# Patient Record
Sex: Female | Born: 1980 | Race: Black or African American | Hispanic: No | Marital: Single | Smoking: Current some day smoker
Health system: Southern US, Community
[De-identification: ages and names within clinical notes are randomized; demographics above are authoritative.]

## PROBLEM LIST (undated history)

## (undated) DIAGNOSIS — D509 Iron deficiency anemia, unspecified: Secondary | ICD-10-CM

## (undated) DIAGNOSIS — D649 Anemia, unspecified: Secondary | ICD-10-CM

## (undated) DIAGNOSIS — F419 Anxiety disorder, unspecified: Secondary | ICD-10-CM

## (undated) DIAGNOSIS — K5090 Crohn's disease, unspecified, without complications: Secondary | ICD-10-CM

## (undated) DIAGNOSIS — N3289 Other specified disorders of bladder: Secondary | ICD-10-CM

## (undated) DIAGNOSIS — L732 Hidradenitis suppurativa: Secondary | ICD-10-CM

## (undated) DIAGNOSIS — K56609 Unspecified intestinal obstruction, unspecified as to partial versus complete obstruction: Secondary | ICD-10-CM

## (undated) DIAGNOSIS — T8859XA Other complications of anesthesia, initial encounter: Secondary | ICD-10-CM

## (undated) DIAGNOSIS — F32A Depression, unspecified: Secondary | ICD-10-CM

## (undated) DIAGNOSIS — Z5189 Encounter for other specified aftercare: Secondary | ICD-10-CM

## (undated) HISTORY — PX: CYSTOSCOPY: SUR368

---

## 2000-10-18 ENCOUNTER — Emergency Department (HOSPITAL_COMMUNITY): Admission: EM | Admit: 2000-10-18 | Discharge: 2000-10-18 | Payer: Self-pay | Admitting: Emergency Medicine

## 2000-10-18 ENCOUNTER — Encounter: Payer: Self-pay | Admitting: Emergency Medicine

## 2000-11-03 ENCOUNTER — Emergency Department (HOSPITAL_COMMUNITY): Admission: EM | Admit: 2000-11-03 | Discharge: 2000-11-03 | Payer: Self-pay

## 2000-11-14 ENCOUNTER — Emergency Department (HOSPITAL_COMMUNITY): Admission: EM | Admit: 2000-11-14 | Discharge: 2000-11-14 | Payer: Self-pay

## 2003-02-15 ENCOUNTER — Emergency Department (HOSPITAL_COMMUNITY): Admission: EM | Admit: 2003-02-15 | Discharge: 2003-02-15 | Payer: Self-pay | Admitting: Emergency Medicine

## 2003-02-24 ENCOUNTER — Emergency Department (HOSPITAL_COMMUNITY): Admission: AD | Admit: 2003-02-24 | Discharge: 2003-02-24 | Payer: Self-pay | Admitting: Emergency Medicine
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
## 2003-10-25 ENCOUNTER — Emergency Department (HOSPITAL_COMMUNITY): Admission: EM | Admit: 2003-10-25 | Discharge: 2003-10-25 | Payer: Self-pay | Admitting: Emergency Medicine

## 2005-05-01 ENCOUNTER — Emergency Department (HOSPITAL_COMMUNITY): Admission: EM | Admit: 2005-05-01 | Discharge: 2005-05-01 | Payer: Self-pay | Admitting: Family Medicine

## 2006-10-18 ENCOUNTER — Emergency Department (HOSPITAL_COMMUNITY): Admission: EM | Admit: 2006-10-18 | Discharge: 2006-10-18 | Payer: Self-pay | Admitting: Emergency Medicine

## 2006-10-22 ENCOUNTER — Emergency Department (HOSPITAL_COMMUNITY): Admission: EM | Admit: 2006-10-22 | Discharge: 2006-10-22 | Payer: Self-pay | Admitting: Family Medicine

## 2007-04-24 ENCOUNTER — Emergency Department (HOSPITAL_COMMUNITY): Admission: EM | Admit: 2007-04-24 | Discharge: 2007-04-24 | Payer: Self-pay | Admitting: Family Medicine

## 2007-04-26 ENCOUNTER — Emergency Department (HOSPITAL_COMMUNITY): Admission: EM | Admit: 2007-04-26 | Discharge: 2007-04-26 | Payer: Self-pay | Admitting: Emergency Medicine

## 2007-11-05 ENCOUNTER — Emergency Department (HOSPITAL_COMMUNITY): Admission: EM | Admit: 2007-11-05 | Discharge: 2007-11-05 | Payer: Self-pay | Admitting: Family Medicine

## 2008-08-16 ENCOUNTER — Emergency Department (HOSPITAL_COMMUNITY): Admission: EM | Admit: 2008-08-16 | Discharge: 2008-08-16 | Payer: Self-pay | Admitting: Emergency Medicine

## 2009-02-14 ENCOUNTER — Emergency Department (HOSPITAL_COMMUNITY): Admission: EM | Admit: 2009-02-14 | Discharge: 2009-02-14 | Payer: Self-pay | Admitting: Emergency Medicine
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
## 2010-04-19 LAB — POCT URINALYSIS DIP (DEVICE)
Bilirubin Urine: NEGATIVE
Glucose, UA: NEGATIVE mg/dL
Ketones, ur: NEGATIVE mg/dL
Nitrite: NEGATIVE
Protein, ur: NEGATIVE mg/dL
Specific Gravity, Urine: 1.01 (ref 1.005–1.030)
Urobilinogen, UA: 0.2 mg/dL (ref 0.0–1.0)
pH: 6.5 (ref 5.0–8.0)

## 2010-04-19 LAB — POCT PREGNANCY, URINE: Preg Test, Ur: NEGATIVE

## 2010-10-07 LAB — POCT PREGNANCY, URINE
Operator id: 235561
Preg Test, Ur: NEGATIVE

## 2010-10-07 LAB — WOUND CULTURE: Culture: NO GROWTH

## 2010-10-13 LAB — WET PREP, GENITAL: WBC, Wet Prep HPF POC: NONE SEEN

## 2010-10-13 LAB — GC/CHLAMYDIA PROBE AMP, GENITAL: GC Probe Amp, Genital: NEGATIVE

## 2010-10-13 LAB — POCT URINALYSIS DIP (DEVICE)
Ketones, ur: 40 — AB
Protein, ur: NEGATIVE
Specific Gravity, Urine: 1.015
Urobilinogen, UA: 1
pH: 6

## 2010-10-23 LAB — CBC
Hemoglobin: 11.6 — ABNORMAL LOW
MCHC: 32.9
RBC: 4.58
WBC: 13.4 — ABNORMAL HIGH

## 2010-10-23 LAB — POCT URINALYSIS DIP (DEVICE)
Protein, ur: 100 — AB
Specific Gravity, Urine: 1.015
Urobilinogen, UA: 0.2

## 2010-10-23 LAB — I-STAT 8, (EC8 V) (CONVERTED LAB)
Acid-Base Excess: 1
Bicarbonate: 25.6 — ABNORMAL HIGH
Chloride: 104
HCT: 42
Hemoglobin: 14.3
Operator id: 116391
Sodium: 138
pCO2, Ven: 39.4 — ABNORMAL LOW

## 2010-10-23 LAB — POCT I-STAT CREATININE: Creatinine, Ser: 0.7

## 2010-10-23 LAB — DIFFERENTIAL
Basophils Relative: 0
Lymphocytes Relative: 5 — ABNORMAL LOW
Monocytes Relative: 4
Neutro Abs: 12.1 — ABNORMAL HIGH
Neutrophils Relative %: 90 — ABNORMAL HIGH

## 2011-05-23 ENCOUNTER — Emergency Department (INDEPENDENT_AMBULATORY_CARE_PROVIDER_SITE_OTHER)
Admission: EM | Admit: 2011-05-23 | Discharge: 2011-05-23 | Disposition: A | Discharge status: Home / Self Care | Payer: Self-pay | Source: Home / Self Care | Attending: Emergency Medicine | Admitting: Emergency Medicine

## 2011-05-23 ENCOUNTER — Encounter (HOSPITAL_COMMUNITY): Payer: Self-pay | Admitting: Emergency Medicine

## 2011-05-23 DIAGNOSIS — L0291 Cutaneous abscess, unspecified: Secondary | ICD-10-CM

## 2011-05-23 DIAGNOSIS — L03319 Cellulitis of trunk, unspecified: Secondary | ICD-10-CM

## 2011-05-23 MED ORDER — ONDANSETRON 4 MG PO TBDP
ORAL_TABLET | ORAL | Status: AC
  Filled 2011-05-23: qty 2

## 2011-05-23 MED ORDER — CEFTRIAXONE SODIUM 1 G IJ SOLR
1.0000 g | Freq: Once | INTRAMUSCULAR | Status: AC
  Administered 2011-05-23: 1 g via INTRAMUSCULAR

## 2011-05-23 MED ORDER — SULFAMETHOXAZOLE-TMP DS 800-160 MG PO TABS: 2.0000 | ORAL_TABLET | Freq: Two times a day (BID) | ORAL | Status: AC

## 2011-05-23 MED ORDER — HYDROMORPHONE HCL PF 1 MG/ML IJ SOLN
1.0000 mg | Freq: Once | INTRAMUSCULAR | Status: AC
  Administered 2011-05-23: 1 mg via INTRAMUSCULAR

## 2011-05-23 MED ORDER — CEFTRIAXONE SODIUM 1 G IJ SOLR
INTRAMUSCULAR | Status: AC
  Filled 2011-05-23: qty 10

## 2011-05-23 MED ORDER — OXYCODONE-ACETAMINOPHEN 5-325 MG PO TABS: ORAL_TABLET | ORAL | Status: AC

## 2011-05-23 MED ORDER — ONDANSETRON 4 MG PO TBDP
8.0000 mg | ORAL_TABLET | Freq: Once | ORAL | Status: AC
  Administered 2011-05-23: 8 mg via ORAL

## 2011-05-23 MED ORDER — LIDOCAINE HCL (PF) 1 % IJ SOLN
INTRAMUSCULAR | Status: AC
  Filled 2011-05-23: qty 5

## 2011-05-23 MED ORDER — HYDROMORPHONE HCL PF 1 MG/ML IJ SOLN
INTRAMUSCULAR | Status: AC
  Filled 2011-05-23: qty 1

## 2011-05-23 NOTE — ED Notes (Signed)
Pt states about 3 days ago she started having "chest pain". She had some soreness especially under right breast which has gotten progressively worse. She states she does not know of an injury, but she did wear a wire bra.

## 2011-05-23 NOTE — Discharge Instructions (Signed)

## 2011-05-23 NOTE — ED Provider Notes (Signed)
Chief Complaint  Patient presents with  . Abscess    History of Present Illness:   The patient is a 31 year old female who has had a 3 to four-day history of a painful abscess in the right submammary area. This has not drained any pus. She denies any fever or chills. She had a boil on her buttock when she was a child. No other skin abscesses or skin lesions. No history of MRSA or diabetes.  Review of Systems:  Other than noted above, the patient denies any of the following symptoms: Systemic:  No fever, chills or sweats. Skin:  No rash or itching.  PMFSH:  Past medical history, family history, social history, meds, and allergies were reviewed.  No history of diabetes or prior history of abscesses or MRSA.  Physical Exam:   Vital signs:  BP 123/75  Pulse 77  Temp(Src) 98.5 F (36.9 C) (Oral)  Resp 18  SpO2 100%  LMP 05/13/2011 Skin:  She has a large abscess in the right submammary area measuring 5 x 10 cm in size. This was fluctuant, raised, and very tender. Skin was otherwise normal.  No rash.  Procedure:  Verbal informed consent was obtained.  The patient was informed of the risks and benefits of the procedure and understands and accepts.  Identity of the patient was verified verbally and by wristband.   The abscess area described above was prepped with Betadine and alcohol and anesthetized with 5 mL of 2% Xylocaine with epinephrine.  Using a #11 scalpel blade, a singe straight incision was made into the area of fluctulence, yielding a very large amount of malodorous, prurulent drainage.  Routine cultures were obtained.  Blunt dissection was used to break up loculations and the resulting wound cavity was packed with one half inch Iodoform gauze.  A sterile pressure dressing was applied.  Course in Urgent Care Center:   She was given Rocephin 1 g IM, hydromorphone 1 mg IM, and Zofran 8 mg by mouth and tolerated all these meds well without any immediate side effects.  Assessment:  The  encounter diagnosis was Abscess.  Plan:   1.  The following meds were prescribed:   New Prescriptions   OXYCODONE-ACETAMINOPHEN (PERCOCET) 5-325 MG PER TABLET    1 to 2 tablets every 6 hours as needed for pain.   SULFAMETHOXAZOLE-TRIMETHOPRIM (BACTRIM DS) 800-160 MG PER TABLET    Take 2 tablets by mouth 2 (two) times daily.   2.  The patient was instructed in symptomatic care and handouts were given. 3.  The patient was instructed to leave the dressing in place and return again in 48 hours for packing removal. Since this was such a large abscess, it will need to be repacked.  Reuben Likes, MD 05/23/11 [phone]

## 2011-05-25 ENCOUNTER — Emergency Department (INDEPENDENT_AMBULATORY_CARE_PROVIDER_SITE_OTHER)
Admission: EM | Admit: 2011-05-25 | Discharge: 2011-05-25 | Disposition: A | Discharge status: Home / Self Care | Payer: Self-pay | Source: Home / Self Care | Attending: Emergency Medicine | Admitting: Emergency Medicine

## 2011-05-25 ENCOUNTER — Encounter (HOSPITAL_COMMUNITY): Payer: Self-pay | Admitting: Emergency Medicine

## 2011-05-25 DIAGNOSIS — L0291 Cutaneous abscess, unspecified: Secondary | ICD-10-CM

## 2011-05-25 NOTE — ED Notes (Signed)
Pt here for abscess recheck UNDER R BREAST S/P I/D WITH PACKING X 2 DYS AGO.PACKING INTACT WITH OLS SATURATED SERASANG DRAINAGE.TENDERNESS AND MIN DRAINAGE SEEN.DENIES FEVER,CHILLS,N,V.TAKING PRESCRIBED ATB AND PAIN MEDS

## 2011-05-25 NOTE — ED Notes (Signed)
4X4 KERLIX DRESSING APPLIED AND COVERED WITH MEDIPORE TAPE

## 2011-05-25 NOTE — Discharge Instructions (Signed)

## 2011-05-25 NOTE — ED Provider Notes (Signed)
Chief Complaint  Patient presents with  . Wound Check    History of Present Illness:   Arvis returns for a wound recheck. The packing is still in place. She has been taking her antibiotics and denies any fever chills. The area is still painful, but less so. She has been taking her Percocet for pain. Her culture is so far negative.  Review of Systems:  Other than noted above, the patient denies any of the following symptoms: Systemic:  No fever, chills, sweats, weight loss, or fatigue. ENT:  No nasal congestion, rhinorrhea, sore throat, swelling of lips, tongue or throat. Resp:  No cough, wheezing, or shortness of breath. Skin:  No rash, itching, nodules, or suspicious lesions.  PMFSH:  Past medical history, family history, social history, meds, and allergies were reviewed.  Physical Exam:   Vital signs:  BP 107/72  Pulse 80  Temp(Src) 98 F (36.7 C) (Oral)  Resp 20  SpO2 99%  LMP 05/13/2011 Gen:  Alert, oriented, in no distress. Skin:  The dressing was removed and the packing is still in place. There still some tenderness and slight induration, but overall the wound looks considerably better. The packing was removed and the wound cavity still has a little bit of purulent drainage.  Course in Urgent Care Center:   The wound was repacked and a dressing was applied. The patient was instructed to leave the packing in place and return again in another 48 hours. I think at that time we can be the packing out completely.  Assessment:  The encounter diagnosis was Abscess.  Plan:   1.  The following meds were prescribed:   New Prescriptions   No medications on file   2.  The patient was instructed in symptomatic care and handouts were given. 3.  The patient was told to return if becoming worse in any way, if no better in 3 or 4 days, and given some red flag symptoms that would indicate earlier return. She should return again in another 48 hours for packing removal.     Reuben Likes, MD 05/25/11 [phone]

## 2011-05-27 LAB — CULTURE, ROUTINE-ABSCESS

## 2012-10-20 ENCOUNTER — Encounter (HOSPITAL_COMMUNITY): Payer: Self-pay | Admitting: Emergency Medicine

## 2012-10-20 ENCOUNTER — Emergency Department (HOSPITAL_COMMUNITY)
Admission: EM | Admit: 2012-10-20 | Discharge: 2012-10-20 | Disposition: A | Discharge status: Home / Self Care | Payer: Self-pay | Attending: Emergency Medicine | Admitting: Emergency Medicine

## 2012-10-20 DIAGNOSIS — R21 Rash and other nonspecific skin eruption: Secondary | ICD-10-CM | POA: Insufficient documentation

## 2012-10-20 DIAGNOSIS — Z87891 Personal history of nicotine dependence: Secondary | ICD-10-CM | POA: Insufficient documentation

## 2012-10-20 MED ORDER — DIPHENHYDRAMINE HCL 25 MG PO TABS: 25.0000 mg | ORAL_TABLET | Freq: Four times a day (QID) | ORAL | Status: DC | PRN

## 2012-10-20 NOTE — ED Provider Notes (Signed)
CSN: [id number]     Arrival date & time 10/20/12  1250 History   First MD Initiated Contact with Patient 10/20/12 1259     Chief Complaint  Patient presents with  . Rash   (Consider location/radiation/quality/duration/timing/severity/associated sxs/prior Treatment) HPI  This is a 32 year old female who presents with rash. She had onset of fine bumps over her mouth and arms yesterday.  She states they're itchy. She's not had any fevers.  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 She denies any new soaps, lotions, or detergents. She denies any new foods. She has no other complaints at this time.  History reviewed. No pertinent past medical history. History reviewed. No pertinent past surgical history. History reviewed. No pertinent family history. History  Substance Use Topics  . Smoking status: Former Games developer  . Smokeless tobacco: Never Used  . Alcohol Use: Yes     Comment: occasional   OB History   Grav Para Term Preterm Abortions TAB SAB Ect Mult Living                 Review of Systems  Constitutional: Negative for fever.  Respiratory: Negative for shortness of breath.   Cardiovascular: Negative for chest pain.  Skin: Positive for rash.    Allergies  Review of patient's allergies indicates no known allergies.  Home Medications   Current Outpatient Rx  Name  Route  Sig  Dispense  Refill  . diphenhydrAMINE (BENADRYL) 25 MG tablet   Oral   Take 1 tablet (25 mg total) by mouth every 6 (six) hours as needed for itching.   20 tablet   0    BP 120/75  Pulse 72  Temp(Src) 98.5 F (36.9 C) (Oral)  Resp 16  SpO2 100%  LMP 10/10/2012 Physical Exam  Nursing note and vitals reviewed. Constitutional: She is oriented to person, place, and time. She appears well-developed and well-nourished. No distress.  HENT:  Head: Normocephalic and atraumatic.  Mouth/Throat: Oropharynx is clear and moist. No  oropharyngeal exudate.  Oropharynx clear without evidence of ulcerations or petechiae. No erythema Fine papular rash over the skin around the mouth, no erythema noted  Eyes: Pupils are equal, round, and reactive to light.  Neck: Neck supple.  fine papular rash over the right anterior neck without erythema  Cardiovascular: Normal rate, regular rhythm and normal heart sounds.   Pulmonary/Chest: Effort normal and breath sounds normal. No respiratory distress. She has no wheezes.  Abdominal: Soft. There is no tenderness.  Neurological: She is alert and oriented to person, place, and time.  Skin: Skin is warm and dry.  Rash as noted above, no rash noted over the bilateral palms  Psychiatric: She has a normal mood and affect.    ED Course  Procedures (including critical care time) Labs Review Labs Reviewed - No data to display Imaging Review No results found.  EKG Interpretation   None       MDM   1. Rash    This a 32 year old female who presents with rash. She is nontoxic-appearing on exam and vital signs are within normal limits. She has a fine papular rash which appears reactive versus allergic. No evidence of overlying infection. Patient is driving [place] give her any Benadryl here. She'll be instructed to take Benadryl at home. At this time I have low suspicion for hand-foot-and-mouth disease that she has no oral lesions or evidence of rash on her hands.  Patient was given strict return precautions.  After history, exam, and medical workup I feel the patient has been appropriately medically screened and is safe for discharge home. Pertinent diagnoses were discussed with the patient. Patient was given return precautions.    Shon Baton, MD 10/20/12 [phone]

## 2012-10-20 NOTE — ED Notes (Addendum)
Pt c/o rash on mouth and chest.  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

## 2013-06-22 ENCOUNTER — Ambulatory Visit: Payer: Self-pay | Admitting: Obstetrics & Gynecology

## 2013-07-13 ENCOUNTER — Ambulatory Visit: Payer: Self-pay | Admitting: Obstetrics & Gynecology

## 2013-08-07 ENCOUNTER — Emergency Department (HOSPITAL_COMMUNITY)
Admission: EM | Admit: 2013-08-07 | Discharge: 2013-08-07 | Disposition: A | Discharge status: Home / Self Care | Payer: No Typology Code available for payment source | Attending: Emergency Medicine | Admitting: Emergency Medicine

## 2013-08-07 ENCOUNTER — Encounter (HOSPITAL_COMMUNITY): Payer: Self-pay | Admitting: Emergency Medicine

## 2013-08-07 DIAGNOSIS — L02419 Cutaneous abscess of limb, unspecified: Secondary | ICD-10-CM | POA: Insufficient documentation

## 2013-08-07 DIAGNOSIS — L02415 Cutaneous abscess of right lower limb: Secondary | ICD-10-CM

## 2013-08-07 DIAGNOSIS — Z87891 Personal history of nicotine dependence: Secondary | ICD-10-CM | POA: Insufficient documentation

## 2013-08-07 DIAGNOSIS — L03119 Cellulitis of unspecified part of limb: Principal | ICD-10-CM

## 2013-08-07 NOTE — Discharge Instructions (Signed)

## 2013-08-07 NOTE — ED Provider Notes (Signed)
CSN: [id number]     Arrival date & time 08/07/13  0908 History   First MD Initiated Contact with Patient 08/07/13 [phone]     Chief Complaint  Patient presents with  . bump on leg      (Consider location/radiation/quality/duration/timing/severity/associated sxs/prior Treatment) The history is provided by the patient.   patient presents with abscess right lower leg on anterior shin.  Patient has history of abscess.  No fevers or chills.  No significant spreading redness.  His been present for 2 days.  It seems to be getting larger.  No other complaints.  Pain is mild to moderate in severity.  Pain is worse with palpation  History reviewed. No pertinent past medical history. History reviewed. No pertinent past surgical history. No family history on file. History  Substance Use Topics  . Smoking status: Former Research scientist (life sciences)  . Smokeless tobacco: Never Used  . Alcohol Use: Yes     Comment: occasional   OB History   Grav Para Term Preterm Abortions TAB SAB Ect Mult Living                 Review of Systems  All other systems reviewed and are negative.     Allergies  Review of patient's allergies indicates no known allergies.  Home Medications   Prior to Admission medications   Medication Sig Start Date End Date Taking? Authorizing Provider  Fe Fum-Fe Poly-Vit C-Lactobac (FUSION PO) Take 1 capsule by mouth every morning.   Yes Historical Provider, MD  ibuprofen (ADVIL,MOTRIN) 200 MG tablet Take 600-800 mg by mouth every 6 (six) hours as needed for moderate pain.   Yes Historical Provider, MD   BP 127/64  Pulse 85  Temp(Src) 98.4 F (36.9 C) (Oral)  Resp 18  SpO2 100%  LMP 07/24/2013 Physical Exam  Nursing note and vitals reviewed. Constitutional: She is oriented to person, place, and time. She appears well-developed and well-nourished. No distress.  HENT:  Head: Normocephalic and atraumatic.  Eyes: EOM are normal.  Neck: Normal range of motion.  Cardiovascular: Normal rate,  regular rhythm and normal heart sounds.   Pulmonary/Chest: Effort normal and breath sounds normal.  Abdominal: Soft. She exhibits no distension. There is no tenderness.  Musculoskeletal: Normal range of motion.  Small abscess on the right anterior lower leg involving the proximal third of the tibia.  Pointing without drainage.  Very small surrounding erythema.  Small induration.  There is a very small amount of fluctuance  Neurological: She is alert and oriented to person, place, and time.  Skin: Skin is warm and dry.  Psychiatric: She has a normal mood and affect. Judgment normal.    ED Course  Procedures (including critical care time)  INCISION AND DRAINAGE Performed by: Hoy Morn Consent: Verbal consent obtained. Risks and benefits: risks, benefits and alternatives were discussed Time out performed prior to procedure Type: abscess Body area: right anterior tibia Anesthesia: local infiltration Incision was made with a scalpel. Local anesthetic: lidocaine 2% with epinephrine Anesthetic total: 3 ml Complexity: complex Blunt dissection to break up loculations Drainage: purulent Drainage amount: small Packing material: none Patient tolerance: Patient tolerated the procedure well with no immediate complications.     Labs Review Labs Reviewed - No data to display  Imaging Review No results found.   EKG Interpretation None      MDM   Final diagnoses:  Abscess of right leg    Tolerated procedure well.  Small abscess.  No indication for antibiotics after  appropriate incision and drainage performed.  Patient will continue warm compresses.  She understands to return to the ER for new or worsening symptoms    Hoy Morn, MD 08/07/13 1026

## 2013-08-07 NOTE — ED Notes (Signed)
Pt c/o reddened, warm area on lower right leg that has whitehead area in center.  Pt states that she has noticed it for two days now, and it has gotten larger and more painful.  Pt thinks could be insect bite.  Pt denies trying to pop whitehead to drain anything from it.

## 2013-09-02 ENCOUNTER — Encounter (HOSPITAL_COMMUNITY): Payer: Self-pay | Admitting: Emergency Medicine

## 2013-09-02 ENCOUNTER — Emergency Department (HOSPITAL_COMMUNITY)
Admission: EM | Admit: 2013-09-02 | Discharge: 2013-09-02 | Disposition: A | Discharge status: Home / Self Care | Payer: No Typology Code available for payment source | Attending: Emergency Medicine | Admitting: Emergency Medicine

## 2013-09-02 DIAGNOSIS — L02219 Cutaneous abscess of trunk, unspecified: Secondary | ICD-10-CM | POA: Insufficient documentation

## 2013-09-02 DIAGNOSIS — L02211 Cutaneous abscess of abdominal wall: Secondary | ICD-10-CM

## 2013-09-02 DIAGNOSIS — L03319 Cellulitis of trunk, unspecified: Secondary | ICD-10-CM | POA: Diagnosis not present

## 2013-09-02 DIAGNOSIS — E669 Obesity, unspecified: Secondary | ICD-10-CM | POA: Diagnosis not present

## 2013-09-02 DIAGNOSIS — Z87891 Personal history of nicotine dependence: Secondary | ICD-10-CM | POA: Diagnosis not present

## 2013-09-02 MED ORDER — CLINDAMYCIN HCL 150 MG PO CAPS: 150.0000 mg | ORAL_CAPSULE | Freq: Four times a day (QID) | ORAL | Status: DC

## 2013-09-02 MED ORDER — OXYCODONE-ACETAMINOPHEN 5-325 MG PO TABS: 1.0000 | ORAL_TABLET | ORAL | Status: DC | PRN

## 2013-09-02 MED ORDER — LIDOCAINE-EPINEPHRINE 2 %-1:100000 IJ SOLN
20.0000 mL | Freq: Once | INTRAMUSCULAR | Status: AC
  Administered 2013-09-02: 20 mL via INTRADERMAL
  Filled 2013-09-02: qty 1

## 2013-09-02 MED ORDER — IBUPROFEN 800 MG PO TABS
800.0000 mg | ORAL_TABLET | Freq: Once | ORAL | Status: AC
  Administered 2013-09-02: 800 mg via ORAL
  Filled 2013-09-02: qty 1

## 2013-09-02 MED ORDER — CLINDAMYCIN HCL 300 MG PO CAPS
300.0000 mg | ORAL_CAPSULE | Freq: Once | ORAL | Status: AC
  Administered 2013-09-02: 300 mg via ORAL
  Filled 2013-09-02: qty 1

## 2013-09-02 NOTE — Discharge Instructions (Signed)
Please follow up at Urgent Care in 2 days for wound recheck and packing removal.  Take pain medication and antibiotic as prescribed.  Apply warm compress and avoid restrictive clothing.    Abscess Care After An abscess (also called a boil or furuncle) is an infected area that contains a collection of pus. Signs and symptoms of an abscess include pain, tenderness, redness, or hardness, or you may feel a moveable soft area under your skin. An abscess can occur anywhere in the body. The infection may spread to surrounding tissues causing cellulitis. A cut (incision) by the surgeon was made over your abscess and the pus was drained out. Gauze may have been packed into the space to provide a drain that will allow the cavity to heal from the inside outwards. The boil may be painful for 5 to 7 days. Most people with a boil do not have high fevers. Your abscess, if seen early, may not have localized, and may not have been lanced. If not, another appointment may be required for this if it does not get better on its own or with medications. HOME CARE INSTRUCTIONS   Only take over-the-counter or prescription medicines for pain, discomfort, or fever as directed by your caregiver.  When you bathe, soak and then remove gauze or iodoform packs at least daily or as directed by your caregiver. You may then wash the wound gently with mild soapy water. Repack with gauze or do as your caregiver directs. SEEK IMMEDIATE MEDICAL CARE IF:   You develop increased pain, swelling, redness, drainage, or bleeding in the wound site.  You develop signs of generalized infection including muscle aches, chills, fever, or a general ill feeling.  An oral temperature above 102 F (38.9 C) develops, not controlled by medication. See your caregiver for a recheck if you develop any of the symptoms described above. If medications (antibiotics) were prescribed, take them as directed. Document Released: 07/17/2004 Document Revised:  03/23/2011 Document Reviewed: 03/14/2007 [facility] Patient Information 2015 [place], [place]. This information is not intended to replace advice given to you by your health care provider. Make sure you discuss any questions you have with your health care provider.

## 2013-09-02 NOTE — ED Notes (Addendum)
Pt was advised that after meds were administered that she would be brought d/c papers. Registration states that as soon as they d/c pt from the system, pt left the room. Pt left w/o receiving d/c instructions and rx's.

## 2013-09-02 NOTE — ED Notes (Signed)
Pt left before discharge papers and prescriptions were given.  Attempted to contact patient by calling all numbers provided.  No numbers were valid.

## 2013-09-02 NOTE — ED Notes (Signed)
Supplies for I&D at bedside

## 2013-09-02 NOTE — ED Provider Notes (Signed)
CSN: [id number]     Arrival date & time 09/02/13  1037 History   First MD Initiated Contact with Patient 09/02/13 1104     Chief Complaint  Patient presents with  . 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  Patient reports gradual onset of pain, swelling, and redness to her right upper anterior abdominal wall ongoing for the past 3 days. Symptoms felt similar to prior abscess. She suspects the area and is aggravated by her bra strap line. She described pain as a sharp, stabbing sensation worsening with palpation. She took over-the-counter ibuprofen, use Boil-EZ, and warm compress with minimal relief. No active drainage. Denies any fever, nausea vomiting diarrhea. Up-to-date with tetanus.   History reviewed. No pertinent past medical history. No past surgical history on file. No family history on file. History  Substance Use Topics  . Smoking status: Former Research scientist (life sciences)  . Smokeless tobacco: Never Used  . Alcohol Use: Yes     Comment: occasional   OB History   Grav Para Term Preterm Abortions TAB SAB Ect Mult Living                 Review of Systems  Constitutional: Negative for fever.  Gastrointestinal: Positive for abdominal pain.  Skin: Positive for rash.  Neurological: Negative for numbness.      Allergies  Review of patient's allergies indicates no known allergies.  Home Medications   Prior to Admission medications   Medication Sig Start Date End Date Taking? Authorizing Provider  Fe Fum-Fe Poly-Vit C-Lactobac (FUSION PO) Take 1 capsule by mouth every morning.   Yes Historical Provider, MD  ibuprofen (ADVIL,MOTRIN) 200 MG tablet Take 600-800 mg by mouth every 6 (six) hours as needed for moderate pain.   Yes Historical Provider, MD   BP 128/90  Pulse 103  Temp(Src) 98 F (36.7 C) (Oral)   Resp 18  SpO2 98%  LMP 08/24/2013 Physical Exam  Nursing note and vitals reviewed. Constitutional: She appears well-developed and well-nourished. No distress.  Moderately obese African American female with history in no acute distress.  HENT:  Head: Atraumatic.  Eyes: Conjunctivae are normal.  Neck: Neck supple.  Abdominal: Soft. There is tenderness (a large area of induration and fluctuance noted to right upper anterior abdominal wall with surrounding erythema measuring 3x8 centimete, exquisitely tender to palpation ).  Neurological: She is alert.  Skin: No rash noted.  Psychiatric: She has a normal mood and affect.    ED Course  Procedures (including critical care time)  11:15 AM Patient with moderate size abdominal wall abscess with surrounding cellulitis requiring I&D.  Will d/c with pain meds, abx, strict return precaution.  Pt is UTD with tetanus.  12:19 PM INCISION AND DRAINAGE Performed by: Domenic Moras Consent: Verbal consent obtained. Risks and benefits: risks, benefits and alternatives were discussed Type: abscess  Body area: R upper anterior abdominal wall  Anesthesia: local infiltration  Incision was made with a scalpel.  Local anesthetic: lidocaine 2% w epinephrine  Anesthetic total: 8 ml  Complexity: complex Blunt dissection to break up loculations  Drainage: purulent  Drainage amount: large  Packing material: 1/4 in iodoform gauze  Patient tolerance: Patient tolerated the procedure well with no immediate complications.     Labs Review Labs Reviewed - No data to display  Imaging Review No results found.   EKG Interpretation None  MDM   Final diagnoses:  Abdominal wall abscess    BP 128/90  Pulse 103  Temp(Src) 98 F (36.7 C) (Oral)  Resp 18  SpO2 98%  LMP 08/24/2013     Domenic Moras, PA-C 09/02/13 1221

## 2013-09-02 NOTE — ED Notes (Signed)
Pt c/o abscess under rt breast x 3 days.

## 2013-09-03 MED ORDER — CLINDAMYCIN HCL 150 MG PO CAPS: 150.0000 mg | ORAL_CAPSULE | Freq: Four times a day (QID) | ORAL | Status: DC

## 2013-09-03 MED ORDER — OXYCODONE-ACETAMINOPHEN 5-325 MG PO TABS: 1.0000 | ORAL_TABLET | ORAL | Status: DC | PRN

## 2013-09-03 NOTE — ED Notes (Signed)
Opened chart to verify discharge orders for pt, states she did not receive any paperwork at discharge, spoke with Dr Tawnya Crook, information reprinted, pt aware to come pick up.

## 2013-09-03 NOTE — ED Provider Notes (Signed)
Medical screening examination/treatment/procedure(s) were performed by non-physician practitioner and as supervising physician I was immediately available for consultation/collaboration.  Ernestina Patches, MD 09/03/13 [phone]

## 2013-12-06 ENCOUNTER — Emergency Department (HOSPITAL_COMMUNITY)
Admission: EM | Admit: 2013-12-06 | Discharge: 2013-12-06 | Disposition: A | Discharge status: Home / Self Care | Payer: No Typology Code available for payment source | Attending: Emergency Medicine | Admitting: Emergency Medicine

## 2013-12-06 ENCOUNTER — Encounter (HOSPITAL_COMMUNITY): Payer: Self-pay | Admitting: Emergency Medicine

## 2013-12-06 DIAGNOSIS — R51 Headache: Secondary | ICD-10-CM | POA: Insufficient documentation

## 2013-12-06 DIAGNOSIS — R519 Headache, unspecified: Secondary | ICD-10-CM

## 2013-12-06 DIAGNOSIS — Z87891 Personal history of nicotine dependence: Secondary | ICD-10-CM | POA: Insufficient documentation

## 2013-12-06 MED ORDER — OXYCODONE-ACETAMINOPHEN 5-325 MG PO TABS: 1.0000 | ORAL_TABLET | ORAL | Status: DC | PRN

## 2013-12-06 MED ORDER — FLUTICASONE PROPIONATE 50 MCG/ACT NA SUSP: 2.0000 | Freq: Every day | NASAL | Status: DC

## 2013-12-06 MED ORDER — PSEUDOEPHEDRINE HCL ER 120 MG PO TB12: 120.0000 mg | ORAL_TABLET | Freq: Two times a day (BID) | ORAL | Status: DC

## 2013-12-06 NOTE — ED Provider Notes (Signed)
CSN: [id number]     Arrival date & time 12/06/13  1351 History   First MD Initiated Contact with Patient 12/06/13 1400     Chief Complaint  Patient presents with  . Headache   HPI Patient presented to the emergency room with complaints of headache. The symptoms started on Sunday. She has tried over-the-counter ibuprofen which seems to help but the symptoms will return. The patient has had some nasal congestion and a pressure sensation on the left side of her face. The headache is primarily located on the left side. A few days ago she had an earache but that has resolved. She denies any sore throat or fevers. She denies any neck pain or stiffness. No numbness or weakness. She denies any history of prior headaches. No family history of aneurysms or recurrent headache problems. History reviewed. No pertinent past medical history. No past surgical history on file. No family history on file. History  Substance Use Topics  . Smoking status: Former Research scientist (life sciences)  . Smokeless tobacco: Never Used  . Alcohol Use: Yes     Comment: occasional   OB History    No data available     Review of Systems  All other systems reviewed and are negative.     Allergies  Review of patient's allergies indicates no known allergies.  Home Medications   Prior to Admission medications   Medication Sig Start Date End Date Taking? Authorizing Provider  clindamycin (CLEOCIN) 150 MG capsule Take 1 capsule (150 mg total) by mouth every 6 (six) hours. 09/03/13   Ernestina Patches, MD  Fe Fum-Fe Poly-Vit C-Lactobac (FUSION PO) Take 1 capsule by mouth every morning.    Historical Provider, MD  fluticasone (FLONASE) 50 MCG/ACT nasal spray Place 2 sprays into both nostrils daily. 12/06/13   Dorie Rank, MD  ibuprofen (ADVIL,MOTRIN) 200 MG tablet Take 600-800 mg by mouth every 6 (six) hours as needed for moderate pain.    Historical Provider, MD  oxyCODONE-acetaminophen (PERCOCET/ROXICET) 5-325 MG per tablet Take 1 tablet by mouth  every 4 (four) hours as needed for moderate pain or severe pain. 12/06/13   Dorie Rank, MD  pseudoephedrine (SUDAFED 12 HOUR) 120 MG 12 hr tablet Take 1 tablet (120 mg total) by mouth every 12 (twelve) hours. 12/06/13   Dorie Rank, MD   BP 145/79 mmHg  Pulse 91  Temp(Src) 98.2 F (36.8 C) (Oral)  Resp 18  SpO2 100% Physical Exam  Constitutional: She appears well-developed and well-nourished. No distress.  HENT:  Head: Normocephalic and atraumatic.  Right Ear: Tympanic membrane and external ear normal.  Left Ear: Tympanic membrane and external ear normal.  Sinus tenderness to palpation left maxillary region  Eyes: Conjunctivae are normal. Right eye exhibits no discharge. Left eye exhibits no discharge. No scleral icterus.  Neck: Neck supple. No tracheal deviation present.  Cardiovascular: Normal rate, regular rhythm and intact distal pulses.   Pulmonary/Chest: Effort normal and breath sounds normal. No stridor. No respiratory distress. She has no wheezes. She has no rales.  Abdominal: Soft. Bowel sounds are normal. She exhibits no distension. There is no tenderness. There is no rebound and no guarding.  Musculoskeletal: She exhibits no edema or tenderness.  Neurological: She is alert. She has normal strength. No cranial nerve deficit (no facial droop, extraocular movements intact, no slurred speech) or sensory deficit. She exhibits normal muscle tone. She displays no seizure activity. Coordination normal.  Skin: Skin is warm and dry. No rash noted.  Psychiatric: She has  a normal mood and affect.  Nursing note and vitals reviewed.   ED Course  Procedures (including critical care time)   MDM   Final diagnoses:  Sinus headache   Symptoms are suggestive of a sinus headache. I doubt meningitis, aneurysm or subarachnoid hemorrhage.  Discharge home with prescription for Sudafed, percocet and flonase   Dorie Rank, MD 12/06/13 1430

## 2013-12-06 NOTE — ED Notes (Signed)
Pt c/o headache since Sunday w/ sensitivity to light and sound. Denies NVD.

## 2013-12-06 NOTE — Discharge Instructions (Signed)
Sinus Headache °A sinus headache is when your sinuses become clogged or swollen. Sinus headaches can range from mild to severe.  °CAUSES °A sinus headache can have different causes, such as: °· Colds. °· Sinus infections. °· Allergies. °SYMPTOMS  °Symptoms of a sinus headache may vary and can include: °· Headache. °· Pain or pressure in the face. °· Congested or runny nose. °· Fever. °· Inability to smell. °· Pain in upper teeth. °Weather changes can make symptoms worse. °TREATMENT  °The treatment of a sinus headache depends on the cause. °· Sinus pain caused by a sinus infection may be treated with antibiotic medicine. °· Sinus pain caused by allergies may be helped by allergy medicines (antihistamines) and medicated nasal sprays. °· Sinus pain caused by congestion may be helped by flushing the nose and sinuses with saline solution. °HOME CARE INSTRUCTIONS  °· If antibiotics are prescribed, take them as directed. Finish them even if you start to feel better. °· Only take over-the-counter or prescription medicines for pain, discomfort, or fever as directed by your caregiver. °· If you have congestion, use a nasal spray to help reduce pressure. °SEEK IMMEDIATE MEDICAL CARE IF: °· You have a fever. °· You have headaches more than once a week. °· You have sensitivity to light or sound. °· You have repeated nausea and vomiting. °· You have vision problems. °· You have sudden, severe pain in your face or head. °· You have a seizure. °· You are confused. °· Your sinus headaches do not get better after treatment. Many people think they have a sinus headache when they actually have migraines or tension headaches. °MAKE SURE YOU:  °· Understand these instructions. °· Will watch your condition. °· Will get help right away if you are not doing well or get worse. °Document Released: 02/06/2004 Document Revised: 03/23/2011 Document Reviewed: 03/29/2010 °[employer]® Patient Information ©2015 [employer]. This information is not  intended to replace advice given to you by your health care provider. Make sure you discuss any questions you have with your health care provider. ° °

## 2014-01-08 ENCOUNTER — Encounter: Payer: Self-pay | Admitting: *Deleted

## 2014-01-09 ENCOUNTER — Encounter: Payer: Self-pay | Admitting: Obstetrics & Gynecology

## 2014-01-11 ENCOUNTER — Telehealth (HOSPITAL_COMMUNITY): Payer: Self-pay

## 2014-01-11 ENCOUNTER — Emergency Department (HOSPITAL_COMMUNITY): Payer: No Typology Code available for payment source

## 2014-01-11 ENCOUNTER — Encounter (HOSPITAL_COMMUNITY): Payer: Self-pay | Admitting: Emergency Medicine

## 2014-01-11 ENCOUNTER — Emergency Department (HOSPITAL_COMMUNITY)
Admission: EM | Admit: 2014-01-11 | Discharge: 2014-01-11 | Disposition: A | Discharge status: Home / Self Care | Payer: No Typology Code available for payment source | Attending: Emergency Medicine | Admitting: Emergency Medicine

## 2014-01-11 DIAGNOSIS — Z79899 Other long term (current) drug therapy: Secondary | ICD-10-CM | POA: Insufficient documentation

## 2014-01-11 DIAGNOSIS — R079 Chest pain, unspecified: Secondary | ICD-10-CM

## 2014-01-11 DIAGNOSIS — Z87891 Personal history of nicotine dependence: Secondary | ICD-10-CM | POA: Insufficient documentation

## 2014-01-11 DIAGNOSIS — R0789 Other chest pain: Secondary | ICD-10-CM | POA: Insufficient documentation

## 2014-01-11 LAB — CBC
HCT: 33 % — ABNORMAL LOW (ref 36.0–46.0)
Hemoglobin: 10 g/dL — ABNORMAL LOW (ref 12.0–15.0)
MCH: 22.2 pg — AB (ref 26.0–34.0)
MCHC: 30.3 g/dL (ref 30.0–36.0)
MCV: 73.3 fL — ABNORMAL LOW (ref 78.0–100.0)
Platelets: 331 10*3/uL (ref 150–400)
RBC: 4.5 MIL/uL (ref 3.87–5.11)
RDW: 15.9 % — ABNORMAL HIGH (ref 11.5–15.5)
WBC: 5.1 10*3/uL (ref 4.0–10.5)

## 2014-01-11 LAB — I-STAT BETA HCG BLOOD, ED (MC, WL, AP ONLY)

## 2014-01-11 LAB — I-STAT TROPONIN, ED: Troponin i, poc: 0 ng/mL (ref 0.00–0.08)

## 2014-01-11 LAB — BASIC METABOLIC PANEL
ANION GAP: 7 (ref 5–15)
BUN: 15 mg/dL (ref 6–23)
CO2: 23 mmol/L (ref 19–32)
CREATININE: 0.64 mg/dL (ref 0.50–1.10)
Calcium: 8.7 mg/dL (ref 8.4–10.5)
Chloride: 108 mEq/L (ref 96–112)
GFR calc Af Amer: >90 mL/min (ref >90)
GFR calc non Af Amer: >90 mL/min (ref >90)
Glucose, Bld: 87 mg/dL (ref 70–99)
POTASSIUM: 4.2 mmol/L (ref 3.5–5.1)
Sodium: 138 mmol/L (ref 135–145)

## 2014-01-11 IMAGING — CR DG CHEST 2V
2 series · 2 of 2 positions shown · non-contrast
Comparison: None.

CLINICAL DATA: Acute onset of sharp right-sided chest pain and
lightheadedness.

EXAM:
CHEST  2 VIEW

[w chest pa (1 of 2)]
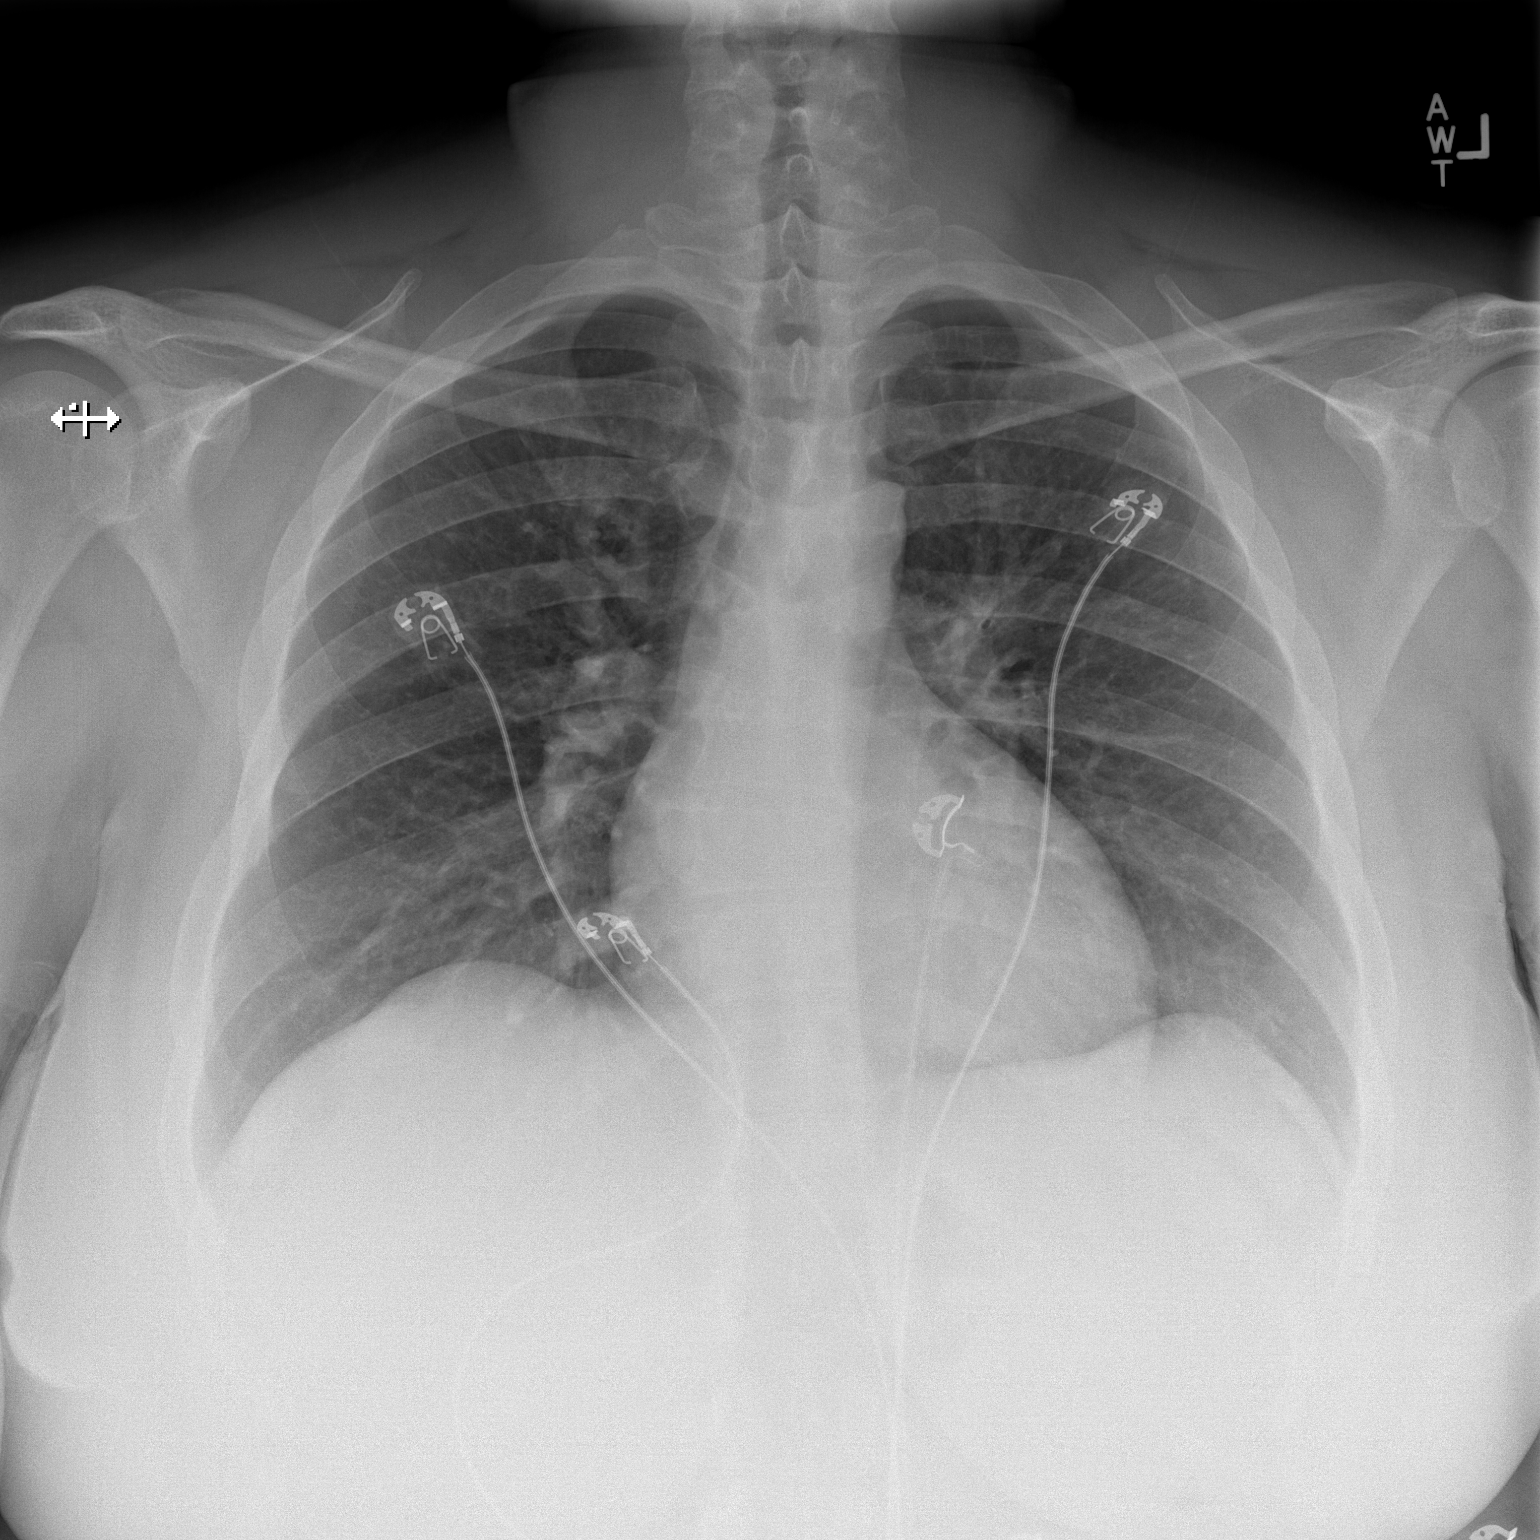

[w chest pa (2 of 2)]
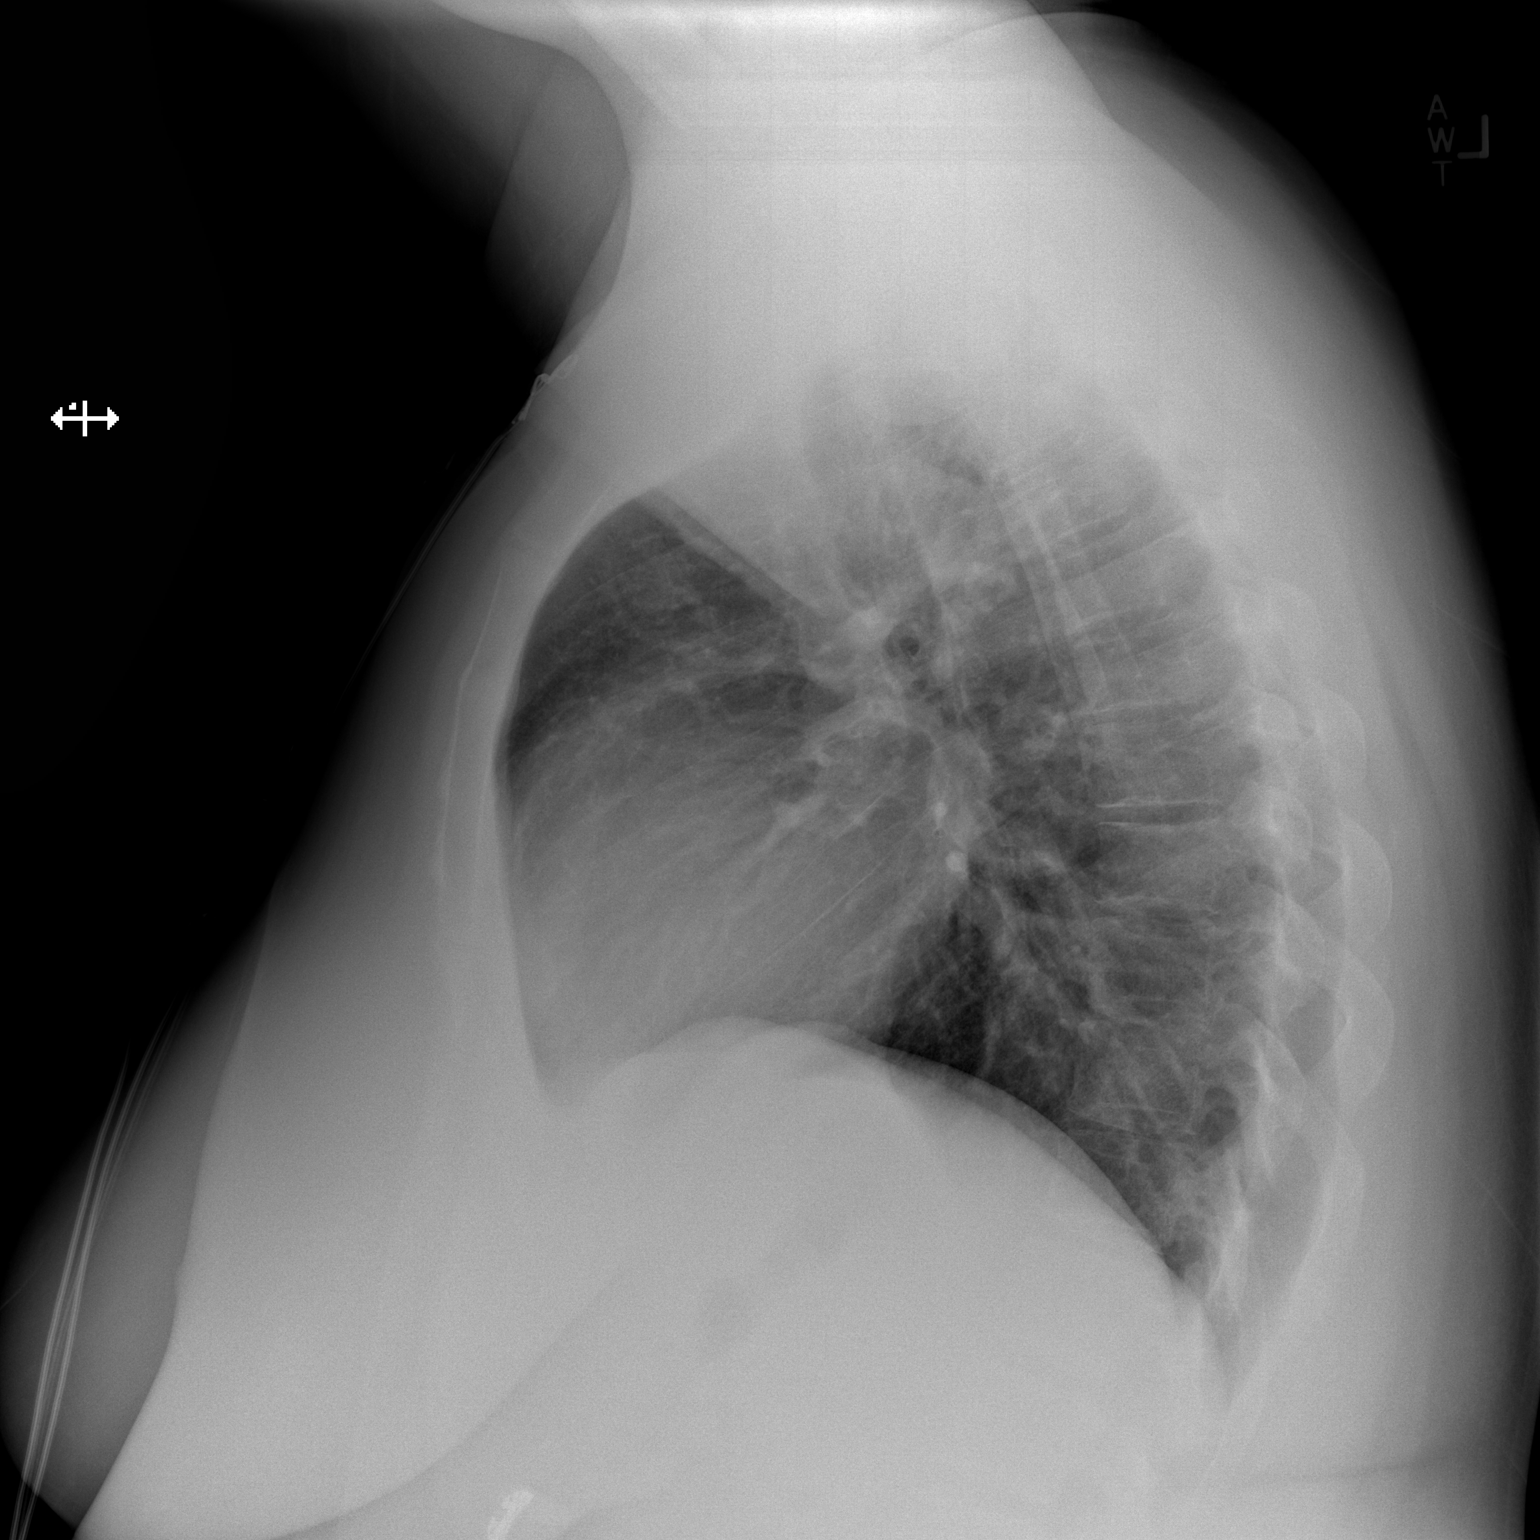

[2 of 2 positions shown; findings below may reference images not displayed]

FINDINGS: The heart size and mediastinal contours are within normal limits.
Both lungs are clear. The visualized skeletal structures are
unremarkable.
IMPRESSION: Normal chest x-ray

## 2014-01-11 MED ORDER — KETOROLAC TROMETHAMINE 60 MG/2ML IM SOLN
30.0000 mg | Freq: Once | INTRAMUSCULAR | Status: AC
  Administered 2014-01-11: 30 mg via INTRAMUSCULAR
  Filled 2014-01-11: qty 2

## 2014-01-11 NOTE — ED Provider Notes (Signed)
CSN: [id number]     Arrival date & time 01/11/14  1058 History   First MD Initiated Contact with Patient 01/11/14 1119     Chief Complaint  Patient presents with  . Chest Pain     (Consider location/radiation/quality/duration/timing/severity/associated sxs/prior Treatment) HPI   Connie Malone is a 33 y.o. female complaining of chest pain described as sharp, left-sided, nonradiating, rated at 8 out of 10, worse with movement onset 3 days ago. Patient states she took Percocet with little relief she took Motrin with excellent relief, no pain medication today, it is rated at 5 out of 10. Patient denies history of DVT or PE, shortness of breath, fever, cough, calf pain, leg swelling, recent immobilizations, exogenous estrogen, history of smoking, cocaine/methamphetamine use, family history of early cardiac death, nausea, vomiting, recent exercise, heavy lifting.   History reviewed. No pertinent past medical history. History reviewed. No pertinent past surgical history. No family history on file. History  Substance Use Topics  . Smoking status: Former Research scientist (life sciences)  . Smokeless tobacco: Never Used  . Alcohol Use: Yes     Comment: occasional   OB History    No data available     Review of Systems  10 systems reviewed and found to be negative, except as noted in the HPI.   Allergies  Review of patient's allergies indicates no known allergies.  Home Medications   Prior to Admission medications   Medication Sig Start Date End Date Taking? Authorizing Provider  Fe Fum-Fe Poly-Vit C-Lactobac (FUSION PO) Take 1 capsule by mouth every morning.   Yes Historical Provider, MD  fluticasone (FLONASE) 50 MCG/ACT nasal spray Place 2 sprays into both nostrils daily. Patient taking differently: Place 2 sprays into both nostrils daily as needed for allergies.  12/06/13  Yes Dorie Rank, MD  ibuprofen (ADVIL,MOTRIN) 200 MG tablet Take 600-800 mg by mouth every 6 (six) hours as needed for moderate pain.    Yes Historical Provider, MD  oxyCODONE-acetaminophen (PERCOCET/ROXICET) 5-325 MG per tablet Take 1 tablet by mouth every 4 (four) hours as needed for moderate pain or severe pain. 12/06/13  Yes Dorie Rank, MD  pseudoephedrine (SUDAFED 12 HOUR) 120 MG 12 hr tablet Take 1 tablet (120 mg total) by mouth every 12 (twelve) hours. Patient taking differently: Take 120 mg by mouth every 12 (twelve) hours as needed for congestion.  12/06/13  Yes Dorie Rank, MD  clindamycin (CLEOCIN) 150 MG capsule Take 1 capsule (150 mg total) by mouth every 6 (six) hours. Patient not taking: Reported on 01/11/2014 09/03/13   Ernestina Patches, MD   BP 111/71 mmHg  Pulse 74  Temp(Src) 97.9 F (36.6 C) (Oral)  Resp 14  SpO2 100%  LMP 01/11/2014 Physical Exam  Constitutional: She is oriented to person, place, and time. She appears well-developed and well-nourished. No distress.  HENT:  Head: Normocephalic and atraumatic.  Mouth/Throat: Oropharynx is clear and moist.  Eyes: Conjunctivae and EOM are normal. Pupils are equal, round, and reactive to light.  Neck: Normal range of motion. Neck supple.  Cardiovascular: Normal rate, regular rhythm and intact distal pulses.   Pulmonary/Chest: Effort normal and breath sounds normal. No stridor. No respiratory distress. She has no wheezes. She has no rales. She exhibits tenderness.    Chest pain is reproducible to palpation, she is exquisitely tender as diagrammed  Abdominal: Soft. Bowel sounds are normal. She exhibits no distension and no mass. There is no tenderness. There is no rebound and no guarding.  Musculoskeletal: Normal  range of motion. She exhibits no edema or tenderness.  No calf asymmetry, superficial collaterals, palpable cords, edema, Homans sign negative bilaterally.    Neurological: She is alert and oriented to person, place, and time.  Psychiatric: She has a normal mood and affect.  Nursing note and vitals reviewed.   ED Course  Procedures (including  critical care time) Labs Review Labs Reviewed  CBC - Abnormal; Notable for the following:    Hemoglobin 10.0 (*)    HCT 33.0 (*)    MCV 73.3 (*)    MCH 22.2 (*)    RDW 15.9 (*)    All other components within normal limits  BASIC METABOLIC PANEL  I-STAT TROPOININ, ED  I-STAT BETA HCG BLOOD, ED ([facility], [facility], [facility] ONLY)    Imaging Review Dg Chest 2 View  01/11/2014   CLINICAL DATA:  Acute onset of sharp right-sided chest pain and lightheadedness.  EXAM: CHEST  2 VIEW  COMPARISON:  None.  FINDINGS: The heart size and mediastinal contours are within normal limits. Both lungs are clear. The visualized skeletal structures are unremarkable.  IMPRESSION: Normal chest x-ray   Electronically Signed   By: Kalman Jewels M.D.   On: 01/11/2014 11:26     EKG Interpretation None      MDM   Final diagnoses:  Chest wall pain    Filed Vitals:   01/11/14 1300 01/11/14 1310 01/11/14 1330 01/11/14 1340  BP: 98/60 98/60 111/71   Pulse: 72 74 74   Temp:    97.9 F (36.6 C)  TempSrc:      Resp: 15 16 14    SpO2: 100% 100% 100%     Medications  ketorolac (TORADOL) injection 30 mg (30 mg Intramuscular Given 01/11/14 1241)    Connie Malone is a pleasant 33 y.o. female presenting with positional left-sided chest pain with no associated symptoms. Pain is completely reproducible to palpation. She is low risk by Wells criteria, PERC negative. EKG with no arrhythmia, no ischemic signs. Troponin is negative, no anemia on CBC. Patient reports improvement with Toradol, I think this is a costochondritis.  Evaluation does not show pathology that would require ongoing emergent intervention or inpatient treatment. Pt is hemodynamically stable and mentating appropriately. Discussed findings and plan with patient/guardian, who agrees with care plan. All questions answered. Return precautions discussed and outpatient follow up given.       Monico Blitz, PA-C 01/12/14 [place], DO 01/13/14  1609

## 2014-01-11 NOTE — ED Notes (Signed)
Pt c/o sharp chest pain and lightheadedness worsening with movement, denies SOB.

## 2014-01-11 NOTE — ED Notes (Addendum)
wanted work note changed and signed. I spoke with discharging RN. She stated the note was correct.  No changes made.  I advised pt i could reprint note and fax it to her work or she could return and have it signed. States that her brother will bring it back to be signed

## 2014-01-11 NOTE — Discharge Instructions (Signed)
For pain control please take ibuprofen (also known as Motrin or Advil) 800mg  (this is normally 4 over the counter pills) 3 times a day  for 5 days. Take with food to minimize stomach irritation.  Do not hesitate to return to the emergency room for any new, worsening or concerning symptoms.  Please obtain primary care using resource guide below. But the minute you were seen in the emergency room and that they will need to obtain records for further outpatient management.    Emergency Department Resource Guide 1) Find a Doctor and Pay Out of Pocket Although you won't have to find out who is covered by your insurance plan, it is a good idea to ask around and get recommendations. You will then need to call the office and see if the doctor you have chosen will accept you as a new patient and what types of options they offer for patients who are self-pay. Some doctors offer discounts or will set up payment plans for their patients who do not have insurance, but you will need to ask so you aren't surprised when you get to your appointment.  2) Contact Your Local Health Department Not all health departments have doctors that can see patients for sick visits, but many do, so it is worth a call to see if yours does. If you don't know where your local health department is, you can check in your phone book. The CDC also has a tool to help you locate your state's health department, and many state websites also have listings of all of their local health departments.  3) Find a [facility] [facility] If your illness is not likely to be very severe or complicated, you may want to try a walk in clinic. These are popping up all over the country in pharmacies, drugstores, and shopping centers. They're usually staffed by nurse practitioners or physician assistants that have been trained to treat common illnesses and complaints. They're usually fairly quick and inexpensive. However, if you have serious medical issues or chronic  medical problems, these are probably not your best option.  No Primary Care Doctor: - Call Health Connect at  [phone] - they can help you locate a primary care doctor that  accepts your insurance, provides certain services, etc. - Physician Referral Service- [phone]  Chronic Pain Problems: Organization         Address  Phone   Notes  [facility]  [phone] Patients need to be referred by their primary care doctor.   Medication Assistance: Organization         Address  Phone   Notes  [facility] Medication [facility] [address] [place], [place] [address] [phone] --Must be a resident of [facility] -- Must have NO insurance coverage whatsoever (no Medicaid/ Medicare, etc.) -- The pt. MUST have a primary care doctor that directs their care regularly and follows them in the community   [facility]ist  [phone]   [employer]  [phone]    Agencies that provide inexpensive medical care: Organization         Address  Phone   Notes  [place]  [phone]   [facility]    [phone]   [facility] [place], [place] [address] [phone]   [place] [address] [place] [phone]   Planned Parenthood    [phone]  [facility]    [phone]   [facility]  [address], [place] Phone:  [phone], Fax:  [phone] Hours of Operation:  9 am - 6 pm, M-F.  Also accepts Medicaid/Medicare and self-pay.  [facility] [place] [address], [address], [place] Phone: [phone], Fax: [phone]. Hours of Operation:  8:30 am - 5:30 pm, M-F.  Also accepts Medicaid and self-pay.  [facility] [place] [address], [place] Phone: [phone]   [place], [place], [place] [phone],  Ext. [phone] Mondays & Thursdays: 7-9 AM.  First 15 patients are seen on a first come, first serve basis.    [place] Providers:  Organization         Address  Phone   Notes  [facility] [address], [place] [phone] Also accepts self-pay patients.  [facility] [address], [place]  [phone]   [place], [address], [place] [phone]   [facility] [address] [place] [phone]   Lucianne Lei [address], [place]   [phone] Only accepts [place] Access [place] patients after they have their name applied to their card.   Self-Pay (no insurance) in [facility]:  Organization         Address  Phone   Notes  Sickle Cell Patients, [facility] [place] [phone]   [facility] [place] [phone]   [facility] [place]  [place], [place], [place] [phone]   [facility]/Dr. Osei-Bonsu  [address], [place] [phone] Phone number for both [place] and [place] locations is the same.  [facility] [address], [place] [phone]   [facility] [address] [place] or [address] [phone]   [facility] [address] [place] [phone], phone; [phone], fax Sees patients 1st and 3rd Saturday of every month.  Must not qualify for public or private insurance (i.e. Medicaid, Medicare, [place] Health Choice, Veterans' Benefits)  Household income should be no more than 200% of the poverty level The clinic cannot treat you if you are pregnant or think you are pregnant  Sexually transmitted diseases are not  treated at the clinic.    Dental Care: Organization         Address  Phone  Notes  [facility] Department of [facility] [phone] Accepts children up to age 45 who are enrolled in [place] or [place]; pregnant women with a Medicaid card; and children who have applied for Medicaid or [place] Health Choice, but were declined, whose parents can pay a reduced fee at time of service.  [facility] Department of [facility]  [address], [place] [phone] Accepts children up to age 59 who are enrolled in [place] or [place]; pregnant women with a Medicaid card; and children who have applied for Medicaid or [place] Health Choice, but were declined, whose parents can pay a reduced fee at time  of service.  [facility]  [place] [phone] Patients are seen by appointment only. Walk-ins are not accepted. [place] will see patients 70 years of age and older. Monday - Tuesday (8am-5pm) Most Wednesdays (8:30-5pm) $30 per visit, cash only  [facility] Adult [facility] Access PROGRAM  [address], [facility] [phone] Patients are seen by appointment only. Walk-ins are not accepted. [place] will see patients 23 years of age and older. One Wednesday Evening (Monthly: Volunteer Based).  $30 per visit, cash only  [place]  [phone] for adults; Children under age 30, call Graduate Pediatric Dentistry at [phone]. Children aged 52-14, please call [phone] to request a pediatric application.  Dental services are provided in all areas of dental care including fillings, crowns and bridges, complete and partial dentures, implants, gum treatment, root canals, and extractions. Preventive care is also provided. Treatment is provided to both adults and children. Patients are selected via a lottery and there is  often a waiting list.   [facility] [address] [place]  [phone] [email or web address]   [facility] [address] [place], [place] [phone], Ext. [phone] Second and Fourth Thursday of each month, opens at 6:30 AM; Clinic ends at 9 AM.  Patients are seen on a first-come first-served basis, and a limited number are seen during each clinic.   [facility]  [address] [place] Danker [place], [place] [phone]   Eligibility Requirements You must have lived in [place], [place], or [place] counties for at least the last three months.   You cannot be eligible for state or federal sponsored [employer], including [employer], [place], or [employer].   You generally cannot be eligible for healthcare insurance through your employer.    How to apply: Eligibility screenings are held every Tuesday and Wednesday afternoon from 1:00 pm until 4:00 pm. You do not need an appointment for the interview!  [facility] [address], [place], [place]   [place]  [place] Department  [place]  [phone]    Behavioral Health Resources in the Community: Intensive Outpatient Programs Organization         Address  Phone  Notes  [place]. [address], [place], [place] [phone]   [facility] Outpatient [address] [place], [place]   ADS: Alcohol & Drug S[facility] [address] [place], [place]   [place] [address],  [place], [place] or [phone]   Substance Abuse Resources Organization         Address  Phone  Notes  Alcohol and Drug Services  [phone]   [place]  [phone]   [facility] [place]   Chinita Pester  [phone]   Residential & Outpatient Substance  Abuse Program  [phone]   Psychological Services Organization         Address  Phone  Notes  [facility]  [place]  [phone]   [place] [address] [place] [phone] or [phone]    Mobile Crisis Teams Organization         Address  Phone  Notes  [facility], Mobile Crisis Care Unit  [phone]   Assertive Psychotherapeutic  Services  [address] [place], [place]   [facility] [address], [place] [phone]    Self-Help/Support Groups Organization         Address  Phone             Notes  [place]. of [place] - variety of support groups  [place] Call for more information  Narcotics Anonymous (NA), [facility] [address], [employer] [place]  2 meetings at this location   Special educational needs teacher         Address  Phone  Notes  [facility] Residential Treatment [place],    [place]  [phone]   [facility]  [address] [place] [address], [place], [place]   [place], [place] [phone] Admissions: 8am-3pm M-F  Incentives Substance [place] [address]    [place], [place] [phone]   [facility] [address] [place], [place], [place]   [facility] [address],  [place], [place]   [facility] - Intensive Outpatient [address] [place], [place], [place]   [facility] ([place].,  [place], [place] [phone] or [phone]   [facility] (RTS) [address] [place], [place] Accepts Medicaid  [facility] [place] [address],  [place] [phone] Substance Abuse/Addiction Treatment   [facility] Organization          Address  Phone  Notes  [facility]  [phone]   Domenic Schwab, PhD [address] [place], [place]   [phone] or [phone]   [place], [place] [phone]   [facility] [address], [place], [place] [phone] Insurance/Medicaid/sponsorship through [facility] and Families [address], [address] [place]                                    [place], [place] [phone] [place] [address], [place] [phone]    Dr. Adele Schilder  [phone]   [facility] Dept. 1) [address] [place] 2) [place] 3)  [place] [address], [place] [phone]  [phone]   [place] [phone] or [phone] (After Hours)

## 2015-11-25 ENCOUNTER — Emergency Department (HOSPITAL_COMMUNITY)
Admission: EM | Admit: 2015-11-25 | Discharge: 2015-11-26 | Disposition: A | Discharge status: Home / Self Care | Payer: No Typology Code available for payment source | Attending: Emergency Medicine | Admitting: Emergency Medicine

## 2015-11-25 ENCOUNTER — Encounter (HOSPITAL_COMMUNITY): Payer: Self-pay

## 2015-11-25 DIAGNOSIS — S40861A Insect bite (nonvenomous) of right upper arm, initial encounter: Secondary | ICD-10-CM | POA: Insufficient documentation

## 2015-11-25 DIAGNOSIS — Y939 Activity, unspecified: Secondary | ICD-10-CM | POA: Insufficient documentation

## 2015-11-25 DIAGNOSIS — Y929 Unspecified place or not applicable: Secondary | ICD-10-CM | POA: Insufficient documentation

## 2015-11-25 DIAGNOSIS — W57XXXA Bitten or stung by nonvenomous insect and other nonvenomous arthropods, initial encounter: Secondary | ICD-10-CM | POA: Insufficient documentation

## 2015-11-25 DIAGNOSIS — Z87891 Personal history of nicotine dependence: Secondary | ICD-10-CM | POA: Insufficient documentation

## 2015-11-25 DIAGNOSIS — Y990 Civilian activity done for income or pay: Secondary | ICD-10-CM | POA: Insufficient documentation

## 2015-11-25 NOTE — ED Triage Notes (Signed)
Pt complains of several bug bites on her arms that is happening at work

## 2015-11-26 MED ORDER — FAMOTIDINE 20 MG PO TABS
20.0000 mg | ORAL_TABLET | Freq: Once | ORAL | Status: AC
  Administered 2015-11-26: 20 mg via ORAL
  Filled 2015-11-26: qty 1

## 2015-11-26 MED ORDER — TRIAMCINOLONE ACETONIDE 0.5 % EX OINT: 1.0000 "application " | TOPICAL_OINTMENT | Freq: Two times a day (BID) | CUTANEOUS | 0 refills | Status: DC

## 2015-11-26 NOTE — Discharge Instructions (Signed)
Please use the triamcinolone .5% cream on the bites to help with itching. Take a benadryl tonight for the itching. If the area becomes more red, or swollen and you develop fevers return to the ED. Follow up with your primary care doctor for further referal to allergist or derm. You may also take zantac or pepcid to help with itching.

## 2015-11-26 NOTE — ED Provider Notes (Signed)
[facility] DEPT Provider Note   CSN: [id number] Arrival date & time: 11/25/15  2228     History   Chief Complaint Chief Complaint  Patient presents with  . insect bites    HPI Connie Malone is a 35 y.o. female.  35 year old African-American female with no significant past medical history presents to the ED today with rash to right upper arm that occurred prior to arrival. Patient states she was at work and noticed her right upper arm was itching and noticed she had developed several bug bites. Patient has history of same at work that has self resolved. She recently had her house exterminated. Patient states she took 20 mg of Benadryl that has not helped. Nothing makes better or worse. Denies any fever or chills. Denies any other complaints.      History reviewed. No pertinent past medical history.  There are no active problems to display for this patient.   History reviewed. No pertinent surgical history.  OB History    No data available       Home Medications    Prior to Admission medications   Medication Sig Start Date End Date Taking? Authorizing Provider  clindamycin (CLEOCIN) 150 MG capsule Take 1 capsule (150 mg total) by mouth every 6 (six) hours. Patient not taking: Reported on 01/11/2014 09/03/13   Ernestina Patches, MD  Fe Fum-Fe Poly-Vit C-Lactobac (FUSION PO) Take 1 capsule by mouth every morning.    Historical Provider, MD  fluticasone (FLONASE) 50 MCG/ACT nasal spray Place 2 sprays into both nostrils daily. Patient taking differently: Place 2 sprays into both nostrils daily as needed for allergies.  12/06/13   Dorie Rank, MD  ibuprofen (ADVIL,MOTRIN) 200 MG tablet Take 600-800 mg by mouth every 6 (six) hours as needed for moderate pain.    Historical Provider, MD  oxyCODONE-acetaminophen (PERCOCET/ROXICET) 5-325 MG per tablet Take 1 tablet by mouth every 4 (four) hours as needed for moderate pain or severe pain. 12/06/13   Dorie Rank, MD  pseudoephedrine  (SUDAFED 12 HOUR) 120 MG 12 hr tablet Take 1 tablet (120 mg total) by mouth every 12 (twelve) hours. Patient taking differently: Take 120 mg by mouth every 12 (twelve) hours as needed for congestion.  12/06/13   Dorie Rank, MD  triamcinolone ointment (KENALOG) 0.5 % Apply 1 application topically 2 (two) times daily. 11/26/15   Doristine Devoid, PA-C    Family History History reviewed. No pertinent family history.  Social History Social History  Substance Use Topics  . Smoking status: Former Research scientist (life sciences)  . Smokeless tobacco: Never Used  . Alcohol use Yes     Comment: occasional     Allergies   Patient has no known allergies.   Review of Systems Review of Systems  Constitutional: Negative for chills and fever.  HENT: Negative for congestion.   Skin: Positive for rash.  All other systems reviewed and are negative.    Physical Exam Updated Vital Signs BP 111/86   Pulse 70   Temp 98.4 F (36.9 C) (Oral)   Resp 18   LMP 10/30/2015   SpO2 100%   Physical Exam  Constitutional: She appears well-developed and well-nourished. No distress.  Eyes: Right eye exhibits no discharge. Left eye exhibits no discharge. No scleral icterus.  Pulmonary/Chest: No respiratory distress.  Musculoskeletal: Normal range of motion.  Neurological: She is alert.  Skin: No pallor.  Five 1 cm papular wheals noted to the right upper arm that are erythematous and pruritic in  nature. No signs of cellulitis including warmth or streaking.   Nursing note and vitals reviewed.    ED Treatments / Results  Labs (all labs ordered are listed, but only abnormal results are displayed) Labs Reviewed - No data to display  EKG  EKG Interpretation None       Radiology No results found.  Procedures Procedures (including critical care time)  Medications Ordered in ED Medications  famotidine (PEPCID) tablet 20 mg (20 mg Oral Given 11/26/15 0029)     Initial Impression / Assessment and Plan / ED  Course  I have reviewed the triage vital signs and the nursing notes.  Pertinent labs & imaging results that were available during my care of the patient were reviewed by me and considered in my medical decision making (see chart for details).  Clinical Course   Patient with urticarial eruption. Likely bug bites. Discussed that urticaria can be trigger by stress heat cold and for unknown reasons. No signs of anaphylactic reaction; no new medications. No signs of cellulitis or infections. Will treat with triamcinolone cream, Pepcid, and benadryl. Follow up with in 2-3 days. Return precautions discussed. Pt is safe for discharge at this time.    Final Clinical Impressions(s) / ED Diagnoses   Final diagnoses:  Bug bite, initial encounter    New Prescriptions Discharge Medication List as of 11/26/2015 12:21 AM    START taking these medications   Details  triamcinolone ointment (KENALOG) 0.5 % Apply 1 application topically 2 (two) times daily., Starting Tue 11/26/2015, Print         Doristine Devoid, PA-C 11/26/15 [id number]    Charlesetta Shanks, MD 12/06/15 [id number]

## 2019-01-13 HISTORY — PX: OTHER SURGICAL HISTORY: SHX169

## 2019-07-04 DIAGNOSIS — R9389 Abnormal findings on diagnostic imaging of other specified body structures: Secondary | ICD-10-CM | POA: Insufficient documentation

## 2019-07-04 DIAGNOSIS — R109 Unspecified abdominal pain: Secondary | ICD-10-CM | POA: Insufficient documentation

## 2019-07-04 DIAGNOSIS — R1084 Generalized abdominal pain: Secondary | ICD-10-CM | POA: Insufficient documentation

## 2019-07-04 DIAGNOSIS — D509 Iron deficiency anemia, unspecified: Secondary | ICD-10-CM | POA: Insufficient documentation

## 2019-07-24 DIAGNOSIS — D356 Benign neoplasm of aortic body and other paraganglia: Secondary | ICD-10-CM | POA: Insufficient documentation

## 2019-07-27 DIAGNOSIS — D500 Iron deficiency anemia secondary to blood loss (chronic): Secondary | ICD-10-CM | POA: Insufficient documentation

## 2019-07-27 DIAGNOSIS — K50012 Crohn's disease of small intestine with intestinal obstruction: Secondary | ICD-10-CM | POA: Insufficient documentation

## 2020-03-06 ENCOUNTER — Emergency Department (HOSPITAL_COMMUNITY): Payer: Self-pay

## 2020-03-06 ENCOUNTER — Emergency Department (HOSPITAL_COMMUNITY)
Admission: EM | Admit: 2020-03-06 | Discharge: 2020-03-06 | Disposition: A | Discharge status: Home / Self Care | Payer: Self-pay | Attending: Emergency Medicine | Admitting: Emergency Medicine

## 2020-03-06 ENCOUNTER — Encounter (HOSPITAL_COMMUNITY): Payer: Self-pay

## 2020-03-06 ENCOUNTER — Other Ambulatory Visit: Payer: Self-pay

## 2020-03-06 DIAGNOSIS — D649 Anemia, unspecified: Secondary | ICD-10-CM

## 2020-03-06 DIAGNOSIS — K5010 Crohn's disease of large intestine without complications: Secondary | ICD-10-CM

## 2020-03-06 DIAGNOSIS — L02415 Cutaneous abscess of right lower limb: Secondary | ICD-10-CM

## 2020-03-06 DIAGNOSIS — Z87891 Personal history of nicotine dependence: Secondary | ICD-10-CM | POA: Insufficient documentation

## 2020-03-06 DIAGNOSIS — K5090 Crohn's disease, unspecified, without complications: Secondary | ICD-10-CM | POA: Insufficient documentation

## 2020-03-06 HISTORY — DX: Other specified disorders of bladder: N32.89

## 2020-03-06 HISTORY — DX: Iron deficiency anemia, unspecified: D50.9

## 2020-03-06 HISTORY — DX: Crohn's disease, unspecified, without complications: K50.90

## 2020-03-06 HISTORY — DX: Anemia, unspecified: D64.9

## 2020-03-06 HISTORY — DX: Encounter for other specified aftercare: Z51.89

## 2020-03-06 HISTORY — DX: Unspecified intestinal obstruction, unspecified as to partial versus complete obstruction: K56.609

## 2020-03-06 LAB — URINALYSIS, ROUTINE W REFLEX MICROSCOPIC
Bilirubin Urine: NEGATIVE
Glucose, UA: NEGATIVE mg/dL
Hgb urine dipstick: NEGATIVE
Ketones, ur: 5 mg/dL — AB
Leukocytes,Ua: NEGATIVE
Nitrite: NEGATIVE
Protein, ur: NEGATIVE mg/dL
Specific Gravity, Urine: 1.009 (ref 1.005–1.030)
pH: 6 (ref 5.0–8.0)

## 2020-03-06 LAB — TYPE AND SCREEN: Unit division: 0

## 2020-03-06 LAB — CBC
HCT: 24.4 % — ABNORMAL LOW (ref 36.0–46.0)
Hemoglobin: 6.8 g/dL — CL (ref 12.0–15.0)
MCH: 17.7 pg — ABNORMAL LOW (ref 26.0–34.0)
MCHC: 27.9 g/dL — ABNORMAL LOW (ref 30.0–36.0)
MCV: 63.4 fL — ABNORMAL LOW (ref 80.0–100.0)
Platelets: 359 10*3/uL (ref 150–400)
RBC: 3.85 MIL/uL — ABNORMAL LOW (ref 3.87–5.11)
RDW: 19.5 % — ABNORMAL HIGH (ref 11.5–15.5)
WBC: 6.1 10*3/uL (ref 4.0–10.5)
nRBC: 0 % (ref 0.0–0.2)

## 2020-03-06 LAB — COMPREHENSIVE METABOLIC PANEL
ALT: 9 U/L (ref 0–44)
AST: 9 U/L — ABNORMAL LOW (ref 15–41)
Albumin: 3.8 g/dL (ref 3.5–5.0)
Alkaline Phosphatase: 60 U/L (ref 38–126)
Anion gap: 10 (ref 5–15)
BUN: 8 mg/dL (ref 6–20)
CO2: 22 mmol/L (ref 22–32)
Calcium: 8.8 mg/dL — ABNORMAL LOW (ref 8.9–10.3)
Chloride: 102 mmol/L (ref 98–111)
Creatinine, Ser: 0.52 mg/dL (ref 0.44–1.00)
GFR, Estimated: >60 mL/min (ref >60)
Glucose, Bld: 93 mg/dL (ref 70–99)
Potassium: 3.4 mmol/L — ABNORMAL LOW (ref 3.5–5.1)
Sodium: 134 mmol/L — ABNORMAL LOW (ref 135–145)
Total Bilirubin: 0.6 mg/dL (ref 0.3–1.2)
Total Protein: 8.2 g/dL — ABNORMAL HIGH (ref 6.5–8.1)

## 2020-03-06 LAB — LIPASE, BLOOD: Lipase: 22 U/L (ref 11–51)

## 2020-03-06 LAB — ABO/RH: ABO/RH(D): B POS

## 2020-03-06 LAB — PREPARE RBC (CROSSMATCH)

## 2020-03-06 LAB — BPAM RBC

## 2020-03-06 LAB — I-STAT BETA HCG BLOOD, ED (MC, WL, AP ONLY): I-stat hCG, quantitative: <5 m[IU]/mL (ref <5)

## 2020-03-06 IMAGING — CT CT ABD-PELV W/ CM
2 of 3 series · 13 of 32 positions shown, 18 images · IV contrast (OMNIPAQUE 300)
Comparison: [DATE] CT abdomen/pelvis.

CLINICAL DATA: Crohn disease.  Acute abdominal pain.

EXAM:
CT ABDOMEN AND PELVIS WITH CONTRAST
TECHNIQUE: Multidetector CT imaging of the abdomen and pelvis was performed
using the standard protocol following bolus administration of
intravenous contrast.
CONTRAST:  100mL OMNIPAQUE IOHEXOL 300 MG/ML  SOLN

[Series 2: axial st · axial · 0.80mm/px · z∈[-428,-74]mm · 8 of 93 slices shown, 13 images]
[im 11/93  soft-tissue]
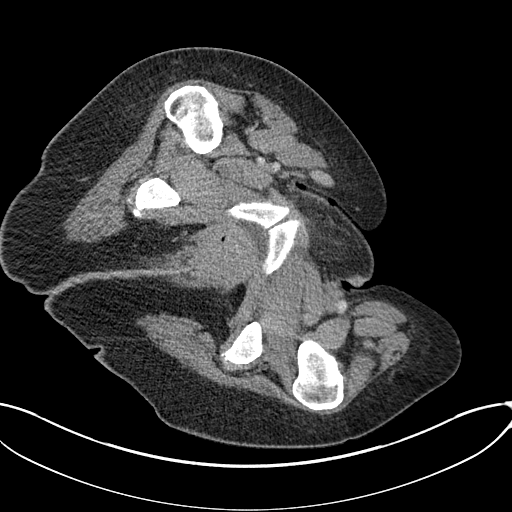
[im 11/93  bone]
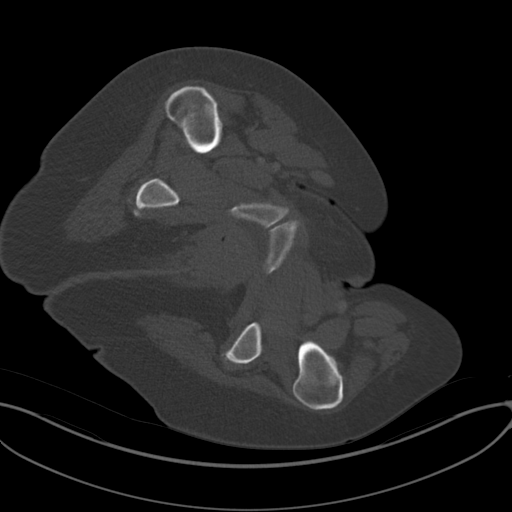
[im 21/93  soft-tissue]
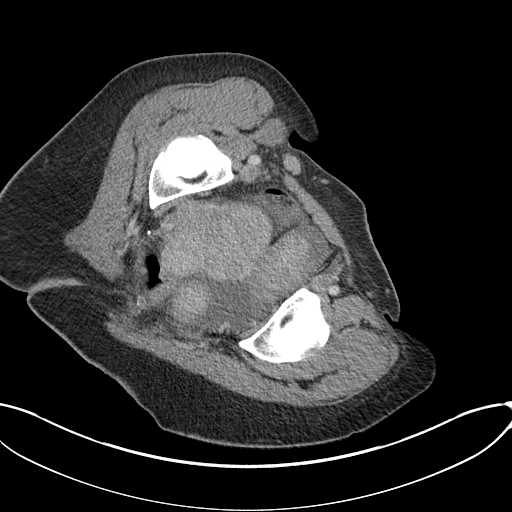
[im 31/93  soft-tissue]
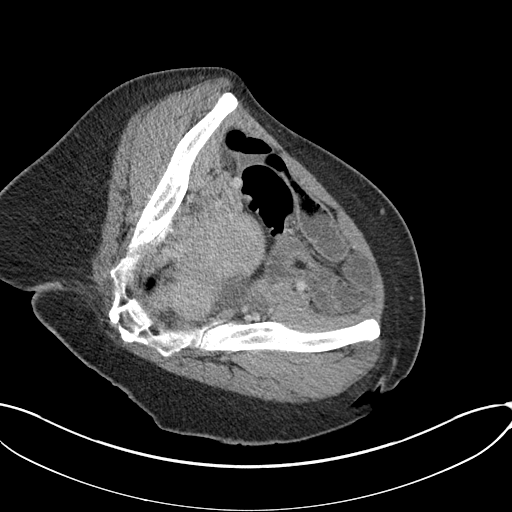
[im 41/93  soft-tissue]
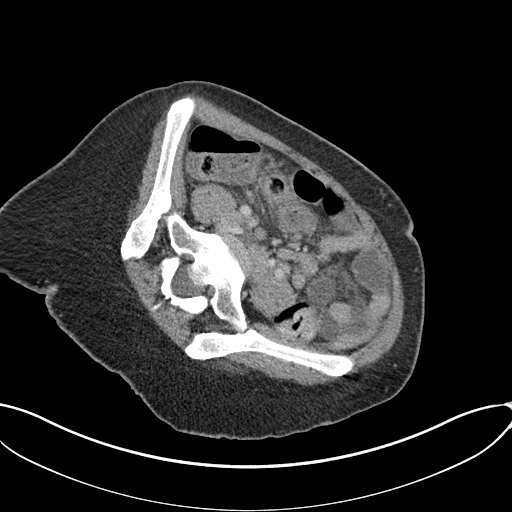
[im 52/93  soft-tissue]
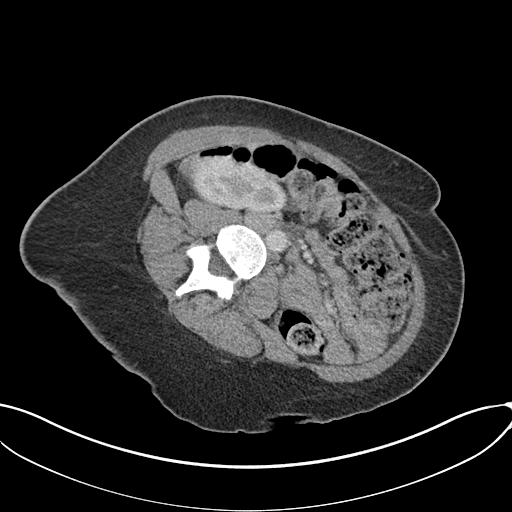
[im 52/93  lung]
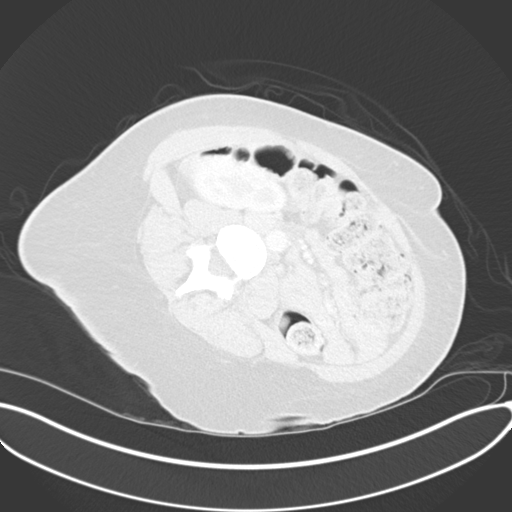
[im 62/93  soft-tissue]
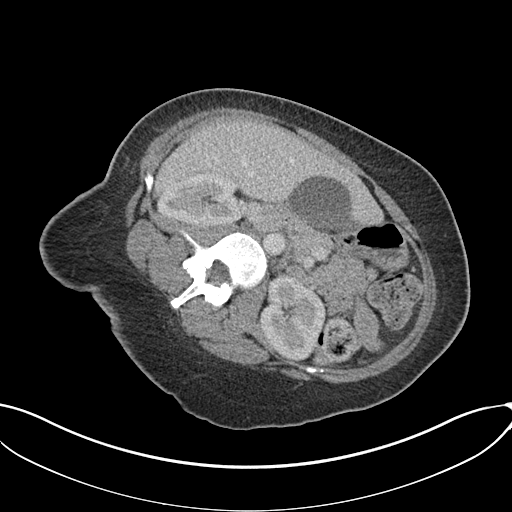
[im 62/93  lung]
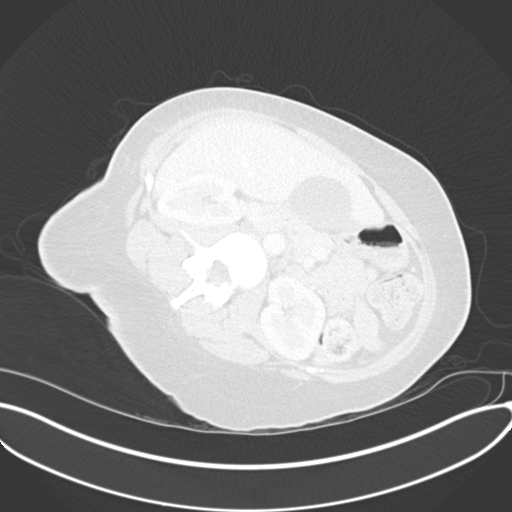
[im 72/93  soft-tissue]
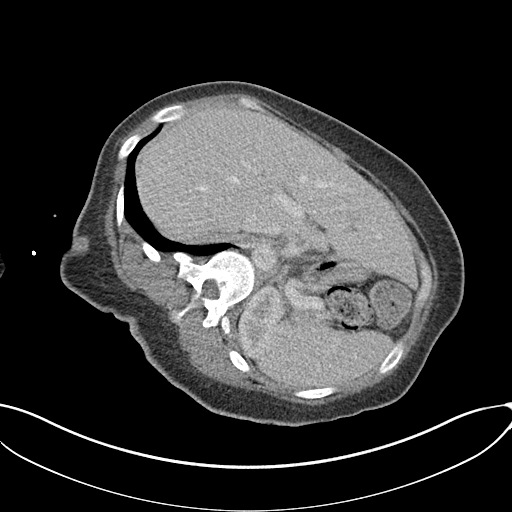
[im 72/93  lung]
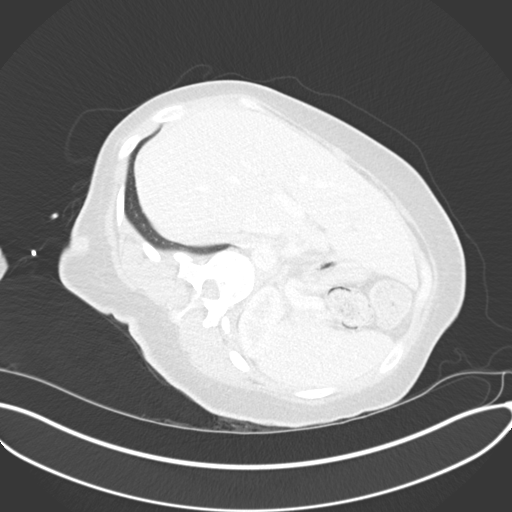
[im 82/93  soft-tissue]
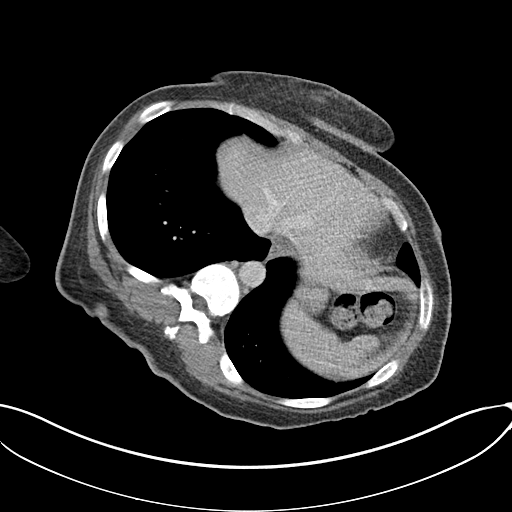
[im 82/93  lung]
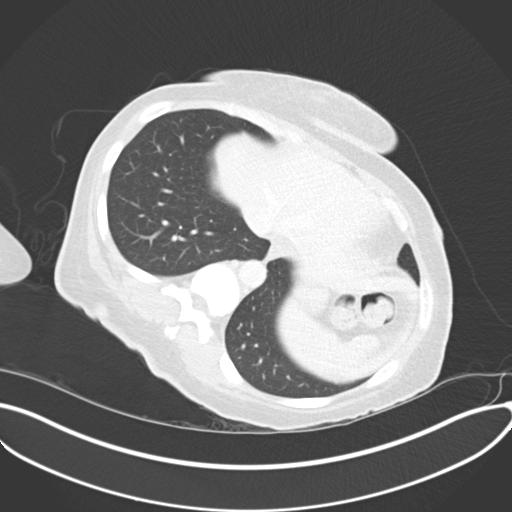

[Series 602: <mpr thick range> · axial · 0.91mm/px · z∈[-447,-224]mm · 5 of 145 slices shown]
[im 11/145  soft-tissue]
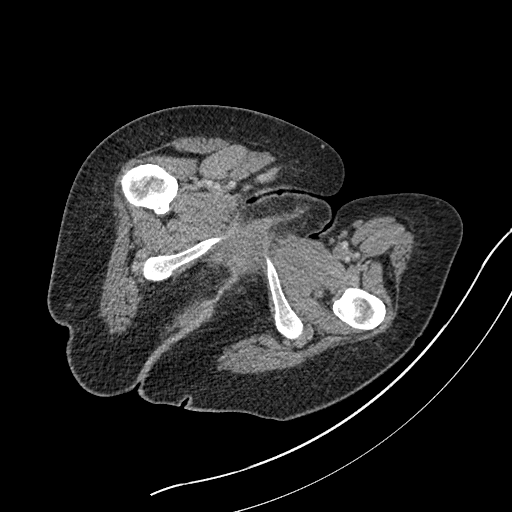
[im 31/145  soft-tissue]
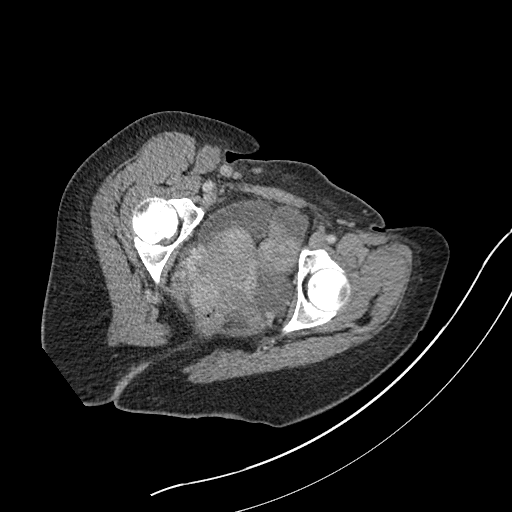
[im 52/145  soft-tissue]
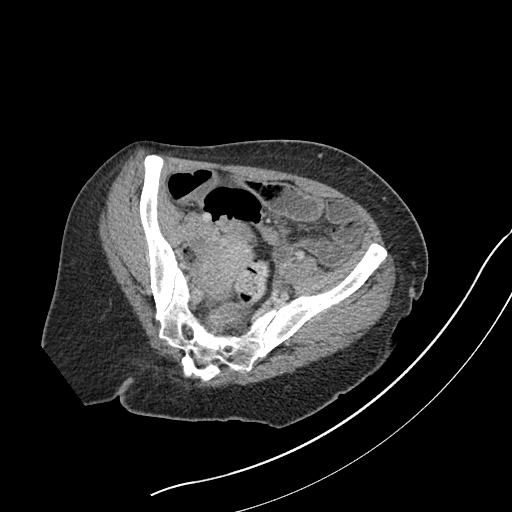
[im 62/145  soft-tissue]
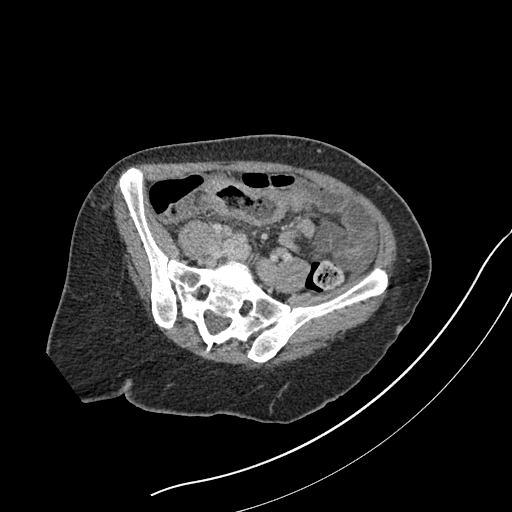
[im 83/145  soft-tissue]
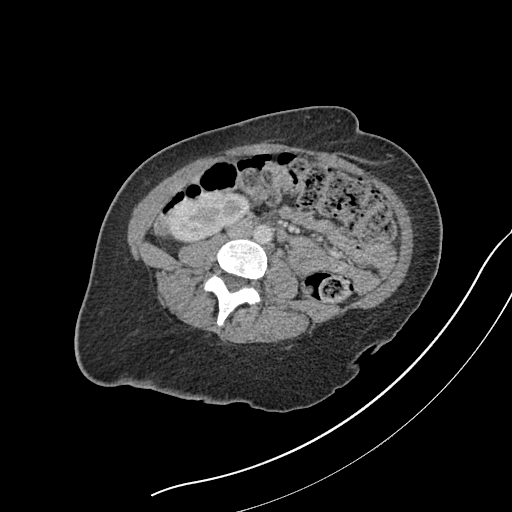

[13 of 32 positions shown; findings below may reference images not displayed]

FINDINGS: Lower chest: No significant pulmonary nodules or acute consolidative
airspace disease.

Hepatobiliary: Normal liver size. No liver mass. Normal gallbladder
with no radiopaque cholelithiasis. No biliary ductal dilatation.

Pancreas: Normal, with no mass or duct dilation.

Spleen: Normal size. No mass.

Adrenals/Urinary Tract: Normal adrenals. Normal kidneys with no
hydronephrosis and no renal mass. Normal bladder.

Stomach/Bowel: Normal non-distended stomach. There is an abnormal 10
cm segment of distal and terminal ileum with mild-to-moderate wall
thickening and mild mucosal hyperenhancement, compatible with active
Crohn inflammation. Mild upstream dilatation of the distal small
bowel up to 3.5 cm diameter with nondilated proximal and mid small
bowel loops. These findings are all less severe compared to the
baseline [DATE] CT study. Candidate diminutive appendix, not
well visualized. Mild wall thickening in shrunken cecum, similar to
prior CT.

Vascular/Lymphatic: Normal caliber abdominal aorta. Patent portal,
splenic, hepatic and renal veins. Mild low right inguinal adenopathy
up to 1.1 cm short axis diameter (series 602/image 137). Top-normal
sized right ileocolic mesenteric nodes up to 0.9 cm, decreased from
prior. No additional pathologically enlarged abdominopelvic nodes.

Reproductive: Bulky myomatous uterus with numerous exophytic
fibroids, not well evaluated by CT, with dominant exophytic
posterior uterine 6.4 cm fibroid, not substantially changed from
prior CT. No definite adnexal masses.

Other: No pneumoperitoneum. Small volume pelvic free fluid. No focal
intraperitoneal collections. Superficial subcutaneous ill-defined
4.1 x 1.1 cm collection in medial lower right gluteal fold with
surrounding subcutaneous fat stranding (series 2/image 88).

Musculoskeletal: No aggressive appearing focal osseous lesions.
IMPRESSION: 1. Active Crohn inflammation involving a 10 cm segment of distal and
terminal ileum. Mild upstream dilatation of the distal small bowel,
compatible with mild partial functional distal small bowel
obstruction. These findings are less severe compared to the baseline
[DATE] CT abdomen/pelvis study.
2. Small volume pelvic free fluid. No focal intraperitoneal
collections.
3. Superficial subcutaneous ill-defined 4.1 x 1.1 cm collection in
the medial lower right gluteal fold with surrounding subcutaneous
fat stranding, which may represent a superficial abscess.
4. Nonspecific mild low right inguinal lymphadenopathy, possibly
reactive.
5. Bulky myomatous uterus.

## 2020-03-06 MED ORDER — SULFAMETHOXAZOLE-TRIMETHOPRIM 800-160 MG PO TABS
1.0000 | ORAL_TABLET | Freq: Once | ORAL | Status: AC
  Administered 2020-03-06: 1 via ORAL
  Filled 2020-03-06: qty 1

## 2020-03-06 MED ORDER — LIDOCAINE-EPINEPHRINE (PF) 2 %-1:200000 IJ SOLN
20.0000 mL | Freq: Once | INTRAMUSCULAR | Status: AC
  Administered 2020-03-06: 20 mL
  Filled 2020-03-06: qty 20

## 2020-03-06 MED ORDER — IOHEXOL 300 MG/ML  SOLN
100.0000 mL | Freq: Once | INTRAMUSCULAR | Status: AC | PRN
  Administered 2020-03-06: 100 mL via INTRAVENOUS

## 2020-03-06 MED ORDER — SODIUM CHLORIDE 0.9 % IV SOLN: 10.0000 mL/h | Freq: Once | INTRAVENOUS | Status: DC

## 2020-03-06 MED ORDER — SULFAMETHOXAZOLE-TRIMETHOPRIM 800-160 MG PO TABS: 1.0000 | ORAL_TABLET | Freq: Two times a day (BID) | ORAL | 0 refills | Status: DC

## 2020-03-06 MED ORDER — POTASSIUM CHLORIDE CRYS ER 20 MEQ PO TBCR
40.0000 meq | EXTENDED_RELEASE_TABLET | Freq: Once | ORAL | Status: AC
  Administered 2020-03-06: 40 meq via ORAL
  Filled 2020-03-06: qty 2

## 2020-03-06 MED ORDER — HYDROMORPHONE HCL 1 MG/ML IJ SOLN
1.0000 mg | Freq: Once | INTRAMUSCULAR | Status: AC
  Administered 2020-03-06: 1 mg via INTRAVENOUS
  Filled 2020-03-06: qty 1

## 2020-03-06 MED ORDER — SODIUM CHLORIDE 0.9 % IV BOLUS
1000.0000 mL | Freq: Once | INTRAVENOUS | Status: AC
  Administered 2020-03-06: 1000 mL via INTRAVENOUS

## 2020-03-06 NOTE — Discharge Instructions (Addendum)
If you develop worsening, continued, or recurrent abdominal pain, uncontrolled vomiting, fever, chest or back pain, or any other new/concerning symptoms then return to the ER for evaluation.   You need to return to the hospital or see your primary care physician/urgent care in 2 days for wound recheck/packing removal.

## 2020-03-06 NOTE — ED Triage Notes (Signed)
Pt presents with c/o abdominal pain. Pt reports she believes she is having a flare-up of her Crohns. Pt is due to have surgery related to this on 3/11 so she was told to stop taking her medication. Pt also c/o boil/abscess on her right thigh.

## 2020-03-06 NOTE — ED Provider Notes (Addendum)
[facility] DEPT Provider Note   CSN: [id number] Arrival date & time: 03/06/20  5009     History Chief Complaint  Patient presents with  . Abdominal Pain  . Recurrent Skin Infections    Connie Malone is a 40 y.o. female.  HPI  40 year old female presents with left lower quadrant abdominal pain that feels like a Crohn's flare for about 3 days.  It is sharp and severe.  Vomited once.  No fevers.  She has been constipated for 3 days.  Prior to this she had a very small amount of blood when she wiped but otherwise has not noticed any significant bleeding.  She has chronic anemia and her last hemoglobin on 2/10 was 6.7.  She is due for IV iron infusion which she has not yet started.  She is also having a right leg abscess for about 3 or 4 days.  No drainage.  It is painful and swollen.  Past Medical History:  Diagnosis Date  . Anemia   . Bladder mass   . Blood transfusion without reported diagnosis   . Crohn disease ([place])   . Intestinal obstruction ([place])   . Iron deficiency anemia   . Microcytic anemia     There are no problems to display for this patient.   Past Surgical History:  Procedure Laterality Date  . CYSTOSCOPY       OB History   No obstetric history on file.     History reviewed. No pertinent family history.  Social History   Tobacco Use  . Smoking status: Former Research scientist (life sciences)  . Smokeless tobacco: Never Used  Substance Use Topics  . Alcohol use: Yes    Comment: occasional  . Drug use: No    Home Medications Prior to Admission medications   Medication Sig Start Date End Date Taking? Authorizing Provider  acetaminophen (TYLENOL) 325 MG tablet Take 650 mg by mouth every 6 (six) hours as needed for mild pain, fever or headache.   Yes [provider]  Adalimumab 40 MG/0.4ML PNKT Inject 0.4 mLs into the skin every 14 (fourteen) days. 07/27/19  Yes [provider]  Fe Fum-Fe Poly-Vit C-Lactobac (FUSION PO) Take  1 capsule by mouth every morning.   Yes [provider]  ferrous sulfate 325 (65 FE) MG tablet Take 1 tablet by mouth daily. 06/17/17  Yes [provider]  Multiple Vitamin (MULTI-VITAMIN) tablet Take 1 tablet by mouth daily.   Yes [provider]  oxybutynin (DITROPAN-XL) 10 MG 24 hr tablet Take 1 tablet by mouth daily. 08/25/19  Yes [provider]  sulfamethoxazole-trimethoprim (BACTRIM DS) 800-160 MG tablet Take 1 tablet by mouth 2 (two) times daily. 03/06/20  Yes Sherwood Gambler, MD  clindamycin (CLEOCIN) 150 MG capsule Take 1 capsule (150 mg total) by mouth every 6 (six) hours. Patient not taking: No sig reported 09/03/13   Ernestina Patches, MD  fluticasone [place]) 50 MCG/ACT nasal spray Place 2 sprays into both nostrils daily. Patient not taking: Reported on 03/06/2020 12/06/13   Dorie Rank, MD  oxyCODONE-acetaminophen (PERCOCET/ROXICET) 5-325 MG per tablet Take 1 tablet by mouth every 4 (four) hours as needed for moderate pain or severe pain. Patient not taking: Reported on 03/06/2020 12/06/13   Dorie Rank, MD  pseudoephedrine (SUDAFED 12 HOUR) 120 MG 12 hr tablet Take 1 tablet (120 mg total) by mouth every 12 (twelve) hours. Patient not taking: Reported on 03/06/2020 12/06/13   Dorie Rank, MD  triamcinolone ointment (KENALOG) 0.5 %  Apply 1 application topically 2 (two) times daily. Patient not taking: Reported on 03/06/2020 11/26/15   Doristine Devoid, PA-C    Allergies    Patient has no known allergies.  Review of Systems   Review of Systems  Constitutional: Negative for fever.  Gastrointestinal: Positive for abdominal pain, constipation and vomiting. Negative for diarrhea.  Skin: Positive for color change.  All other systems reviewed and are negative.   Physical Exam Updated Vital Signs BP 128/89 (BP Location: Left Arm)   Pulse 87   Temp 98.5 F (36.9 C) (Axillary)   Resp 18   Ht 5' 6.5" (1.689 m)   Wt 84.4 kg   LMP 02/12/2020   SpO2  100%   BMI 29.57 kg/m   Physical Exam Vitals and nursing note reviewed.  Constitutional:      General: She is not in acute distress.    Appearance: She is well-developed and well-nourished.  HENT:     Head: Normocephalic and atraumatic.     Right Ear: External ear normal.     Left Ear: External ear normal.     Nose: Nose normal.  Eyes:     General:        Right eye: No discharge.        Left eye: No discharge.  Cardiovascular:     Rate and Rhythm: Normal rate and regular rhythm.     Heart sounds: Normal heart sounds.  Pulmonary:     Effort: Pulmonary effort is normal.     Breath sounds: Normal breath sounds.  Abdominal:     Palpations: Abdomen is soft.     Tenderness: There is abdominal tenderness in the right upper quadrant, left upper quadrant and left lower quadrant.  Skin:    General: Skin is warm and dry.     Findings: Erythema present.     Comments: Right postero-lateral thigh has a superficial 5x3 cm abscess with mild surrounding erythema.   Neurological:     Mental Status: She is alert.  Psychiatric:        Mood and Affect: Mood is not anxious.     ED Results / Procedures / Treatments   Labs (all labs ordered are listed, but only abnormal results are displayed) Labs Reviewed  COMPREHENSIVE METABOLIC PANEL - Abnormal; Notable for the following components:      Result Value   Sodium 134 (*)    Potassium 3.4 (*)    Calcium 8.8 (*)    Total Protein 8.2 (*)    AST 9 (*)    All other components within normal limits  CBC - Abnormal; Notable for the following components:   RBC 3.85 (*)    Hemoglobin 6.8 (*)    HCT 24.4 (*)    MCV 63.4 (*)    MCH 17.7 (*)    MCHC 27.9 (*)    RDW 19.5 (*)    All other components within normal limits  URINALYSIS, ROUTINE W REFLEX MICROSCOPIC - Abnormal; Notable for the following components:   Ketones, ur 5 (*)    All other components within normal limits  LIPASE, BLOOD  I-STAT BETA HCG BLOOD, ED ([facility], [facility], [facility] ONLY)  TYPE AND  SCREEN  PREPARE RBC (CROSSMATCH)  ABO/RH    EKG None  Radiology CT ABDOMEN PELVIS W CONTRAST  Result Date: 03/06/2020 CLINICAL DATA:  Crohn disease.  Acute abdominal pain. EXAM: CT ABDOMEN AND PELVIS WITH CONTRAST TECHNIQUE: Multidetector CT imaging of the abdomen and pelvis was performed using  the standard protocol following bolus administration of intravenous contrast. CONTRAST:  120mL OMNIPAQUE IOHEXOL 300 MG/ML  SOLN COMPARISON:  07/04/2019 CT abdomen/pelvis. FINDINGS: Lower chest: No significant pulmonary nodules or acute consolidative airspace disease. Hepatobiliary: Normal liver size. No liver mass. Normal gallbladder with no radiopaque cholelithiasis. No biliary ductal dilatation. Pancreas: Normal, with no mass or duct dilation. Spleen: Normal size. No mass. Adrenals/Urinary Tract: Normal adrenals. Normal kidneys with no hydronephrosis and no renal mass. Normal bladder. Stomach/Bowel: Normal non-distended stomach. There is an abnormal 10 cm segment of distal and terminal ileum with mild-to-moderate wall thickening and mild mucosal hyperenhancement, compatible with active Crohn inflammation. Mild upstream dilatation of the distal small bowel up to 3.5 cm diameter with nondilated proximal and mid small bowel loops. These findings are all less severe compared to the baseline 07/04/2019 CT study. Candidate diminutive appendix, not well visualized. Mild wall thickening in shrunken cecum, similar to prior CT. Vascular/Lymphatic: Normal caliber abdominal aorta. Patent portal, splenic, hepatic and renal veins. Mild low right inguinal adenopathy up to 1.1 cm short axis diameter (series 602/image 137). Top-normal sized right ileocolic mesenteric nodes up to 0.9 cm, decreased from prior. No additional pathologically enlarged abdominopelvic nodes. 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 No definite adnexal masses. Other: No pneumoperitoneum. Small volume pelvic free fluid. No focal intraperitoneal collections. Superficial subcutaneous ill-defined 4.1 x 1.1 cm collection in medial lower right gluteal fold with surrounding subcutaneous fat stranding (series 2/image 88). Musculoskeletal: No aggressive appearing focal osseous lesions. IMPRESSION: 1. Active Crohn inflammation involving a 10 cm segment of distal and terminal ileum. Mild upstream dilatation of the distal small bowel, compatible with mild partial functional distal small bowel obstruction. These findings are less severe compared to the baseline 07/04/2019 CT abdomen/pelvis study. 2. Small volume pelvic free fluid. No focal intraperitoneal collections. 3. Superficial subcutaneous ill-defined 4.1 x 1.1 cm collection in the medial lower right gluteal fold with surrounding subcutaneous fat stranding, which may represent a superficial abscess. 4. Nonspecific mild low right inguinal lymphadenopathy, possibly reactive. 5. Bulky myomatous uterus. Electronically Signed   By: Ilona Sorrel M.D.   On: 03/06/2020 11:33    Procedures .Marland KitchenIncision and Drainage  Date/Time: 03/06/2020 11:02 AM Performed by: Sherwood Gambler, MD Authorized by: Sherwood Gambler, MD   Consent:    Consent obtained:  Verbal   Consent given by:  Patient Universal protocol:    Patient identity confirmed:  Verbally with patient Location:    Type:  Abscess   Size:  5 cm   Location:  Lower extremity   Lower extremity location:  Leg   Leg location:  R upper leg Pre-procedure details:    Skin preparation:  Povidone-iodine Sedation:    Sedation type:  None Anesthesia:    Anesthesia method:  Local infiltration   Local anesthetic:  Lidocaine 2% WITH epi Procedure type:    Complexity:  Complex Procedure details:    Incision types:  Elliptical   Incision depth:  Dermal   Wound management:  Probed and deloculated   Drainage:   Purulent   Drainage amount:  Copious   Packing materials:  1/4 in iodoform gauze Post-procedure details:    Procedure completion:  Tolerated well, no immediate complications  .Critical Care Performed by: Sherwood Gambler, MD Authorized by: Sherwood Gambler, MD   Critical care provider statement:    Critical care time (minutes):  35  Critical care time was exclusive of:  Separately billable procedures and treating other patients   Critical care was necessary to treat or prevent imminent or life-threatening deterioration of the following conditions:  Circulatory failure   Critical care was time spent personally by me on the following activities:  Discussions with consultants, evaluation of patient's response to treatment, examination of patient, ordering and performing treatments and interventions, ordering and review of laboratory studies, ordering and review of radiographic studies, pulse oximetry, re-evaluation of patient's condition, obtaining history from patient or surrogate and review of old charts    Medications Ordered in ED Medications  0.9 %  sodium chloride infusion (0 mL/hr Intravenous Hold 03/06/20 1012)  HYDROmorphone (DILAUDID) injection 1 mg (1 mg Intravenous Given 03/06/20 1005)  sodium chloride 0.9 % bolus 1,000 mL (0 mLs Intravenous Stopped 03/06/20 1115)  lidocaine-EPINEPHrine (XYLOCAINE W/EPI) 2 %-1:200000 (PF) injection 20 mL (20 mLs Infiltration Given by Other 03/06/20 1045)  iohexol (OMNIPAQUE) 300 MG/ML solution 100 mL (100 mLs Intravenous Contrast Given 03/06/20 1023)  potassium chloride SA (KLOR-CON) CR tablet 40 mEq (40 mEq Oral Given 03/06/20 1255)  sulfamethoxazole-trimethoprim (BACTRIM DS) 800-160 MG per tablet 1 tablet (1 tablet Oral Given 03/06/20 1255)    ED Course  I have reviewed the triage vital signs and the nursing notes.  Pertinent labs & imaging results that were available during my care of the patient were reviewed by me and considered in my medical  decision making (see chart for details).    MDM Rules/Calculators/A&P                          Patient's hemoglobin is slightly improved from a couple weeks ago but given the difficulty with her getting IV iron I think it is reasonable to give a blood transfusion today.  From a Crohn's perspective, CT does show Crohn's colitis. I discussed with Dr. Therisa Doyne, who has reviewed her chart and recommends no further medication management from a Crohn's perspective but she should follow-up with her gastroenterologist as soon as possible.  The abscess was incised and drained as above.  Given the size and some erythema around it we will place on Bactrim.  She is tolerating p.o. here.  Discharge home with return precautions. Final Clinical Impression(s) / ED Diagnoses Final diagnoses:  Crohn's colitis, without complications (HCC)  Anemia, unspecified type  Abscess of right thigh    Rx / DC Orders ED Discharge Orders         Ordered    sulfamethoxazole-trimethoprim (BACTRIM DS) 800-160 MG tablet  2 times daily        03/06/20 1437           Sherwood Gambler, MD 03/06/20 1537    Sherwood Gambler, MD 03/12/20 1416

## 2020-03-06 NOTE — ED Notes (Signed)
I&D site right posterior thigh dressed with gauze and medipore tape. Site CDI, packing strip visible before dressing applied.

## 2020-03-07 LAB — TYPE AND SCREEN
ABO/RH(D): B POS
Antibody Screen: NEGATIVE

## 2020-03-07 LAB — BPAM RBC
Blood Product Expiration Date: 202203122359
ISSUE DATE / TIME: 202202231140
Unit Type and Rh: 7300

## 2020-08-13 ENCOUNTER — Encounter (HOSPITAL_COMMUNITY): Payer: Self-pay

## 2020-08-13 ENCOUNTER — Emergency Department (HOSPITAL_COMMUNITY): Payer: Self-pay

## 2020-08-13 ENCOUNTER — Other Ambulatory Visit: Payer: Self-pay

## 2020-08-13 ENCOUNTER — Emergency Department (HOSPITAL_COMMUNITY)
Admission: EM | Admit: 2020-08-13 | Discharge: 2020-08-14 | Disposition: A | Discharge status: Home / Self Care | Payer: Self-pay | Attending: Emergency Medicine | Admitting: Emergency Medicine

## 2020-08-13 DIAGNOSIS — N9489 Other specified conditions associated with female genital organs and menstrual cycle: Secondary | ICD-10-CM | POA: Insufficient documentation

## 2020-08-13 DIAGNOSIS — Z87891 Personal history of nicotine dependence: Secondary | ICD-10-CM | POA: Insufficient documentation

## 2020-08-13 DIAGNOSIS — W19XXXA Unspecified fall, initial encounter: Secondary | ICD-10-CM | POA: Insufficient documentation

## 2020-08-13 DIAGNOSIS — Y9301 Activity, walking, marching and hiking: Secondary | ICD-10-CM | POA: Insufficient documentation

## 2020-08-13 DIAGNOSIS — K5900 Constipation, unspecified: Secondary | ICD-10-CM | POA: Insufficient documentation

## 2020-08-13 DIAGNOSIS — R519 Headache, unspecified: Secondary | ICD-10-CM | POA: Insufficient documentation

## 2020-08-13 DIAGNOSIS — H53141 Visual discomfort, right eye: Secondary | ICD-10-CM | POA: Insufficient documentation

## 2020-08-13 DIAGNOSIS — R1084 Generalized abdominal pain: Secondary | ICD-10-CM | POA: Insufficient documentation

## 2020-08-13 DIAGNOSIS — S92414A Nondisplaced fracture of proximal phalanx of right great toe, initial encounter for closed fracture: Secondary | ICD-10-CM | POA: Insufficient documentation

## 2020-08-13 LAB — CBC WITH DIFFERENTIAL/PLATELET
Abs Immature Granulocytes: 0.02 10*3/uL (ref 0.00–0.07)
Basophils Absolute: 0 10*3/uL (ref 0.0–0.1)
Basophils Relative: 0 %
Eosinophils Absolute: 0.1 10*3/uL (ref 0.0–0.5)
Eosinophils Relative: 2 %
HCT: 27.3 % — ABNORMAL LOW (ref 36.0–46.0)
Hemoglobin: 8 g/dL — ABNORMAL LOW (ref 12.0–15.0)
Immature Granulocytes: 0 %
Lymphocytes Relative: 7 %
Lymphs Abs: 0.4 10*3/uL — ABNORMAL LOW (ref 0.7–4.0)
MCH: 21.6 pg — ABNORMAL LOW (ref 26.0–34.0)
MCHC: 29.3 g/dL — ABNORMAL LOW (ref 30.0–36.0)
MCV: 73.8 fL — ABNORMAL LOW (ref 80.0–100.0)
Monocytes Absolute: 0.2 10*3/uL (ref 0.1–1.0)
Monocytes Relative: 5 %
Neutro Abs: 4.2 10*3/uL (ref 1.7–7.7)
Neutrophils Relative %: 86 %
Platelets: 378 10*3/uL (ref 150–400)
RBC: 3.7 MIL/uL — ABNORMAL LOW (ref 3.87–5.11)
RDW: 17.4 % — ABNORMAL HIGH (ref 11.5–15.5)
WBC: 4.9 10*3/uL (ref 4.0–10.5)
nRBC: 0 % (ref 0.0–0.2)

## 2020-08-13 LAB — COMPREHENSIVE METABOLIC PANEL
ALT: 11 U/L (ref 0–44)
AST: 15 U/L (ref 15–41)
Albumin: 3.7 g/dL (ref 3.5–5.0)
Alkaline Phosphatase: 55 U/L (ref 38–126)
Anion gap: 6 (ref 5–15)
BUN: 11 mg/dL (ref 6–20)
CO2: 26 mmol/L (ref 22–32)
Calcium: 8.9 mg/dL (ref 8.9–10.3)
Chloride: 103 mmol/L (ref 98–111)
Creatinine, Ser: 0.97 mg/dL (ref 0.44–1.00)
GFR, Estimated: >60 mL/min (ref >60)
Glucose, Bld: 84 mg/dL (ref 70–99)
Potassium: 4 mmol/L (ref 3.5–5.1)
Sodium: 135 mmol/L (ref 135–145)
Total Bilirubin: 0.4 mg/dL (ref 0.3–1.2)
Total Protein: 7.8 g/dL (ref 6.5–8.1)

## 2020-08-13 LAB — URINALYSIS, ROUTINE W REFLEX MICROSCOPIC
Bacteria, UA: NONE SEEN
Bilirubin Urine: NEGATIVE
Glucose, UA: NEGATIVE mg/dL
Ketones, ur: NEGATIVE mg/dL
Leukocytes,Ua: NEGATIVE
Nitrite: NEGATIVE
Protein, ur: NEGATIVE mg/dL
Specific Gravity, Urine: 1.028 (ref 1.005–1.030)
pH: 7 (ref 5.0–8.0)

## 2020-08-13 LAB — I-STAT BETA HCG BLOOD, ED (MC, WL, AP ONLY): I-stat hCG, quantitative: <5 m[IU]/mL (ref <5)

## 2020-08-13 LAB — LIPASE, BLOOD: Lipase: 37 U/L (ref 11–51)

## 2020-08-13 IMAGING — CT CT HEAD W/O CM
3 of 5 series · 16 of 47 positions shown, 19 images · non-contrast
Comparison: None.

CLINICAL DATA: Headache, chronic, new features or increased
frequency

EXAM:
CT HEAD WITHOUT CONTRAST
TECHNIQUE: Contiguous axial images were obtained from the base of the skull
through the vertex without intravenous contrast.

[Series 2: head wo · axial · 0.39mm/px · z∈[-127,+3]mm · 10 of 32 slices shown, 13 images]
[im 3/32  brain]
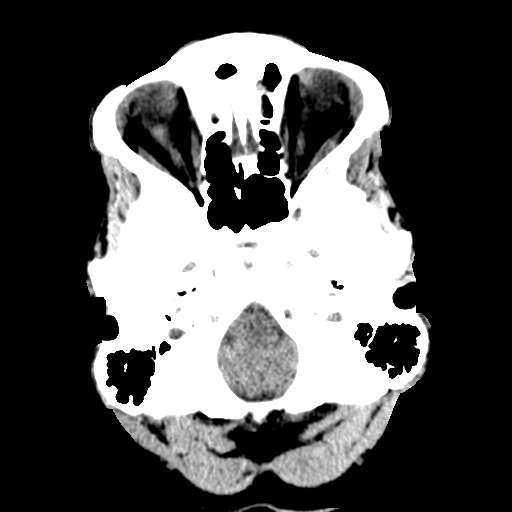
[im 3/32  bone]
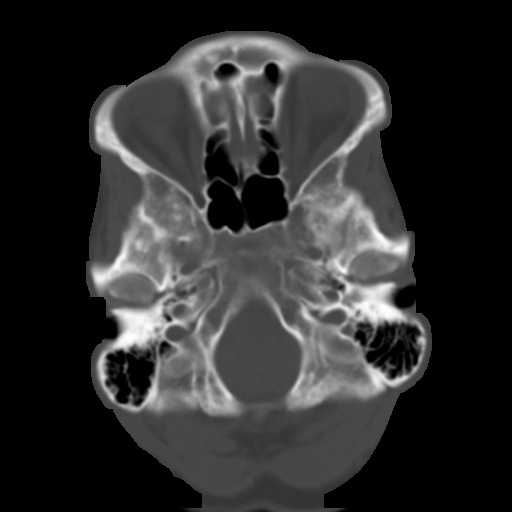
[im 6/32  brain]
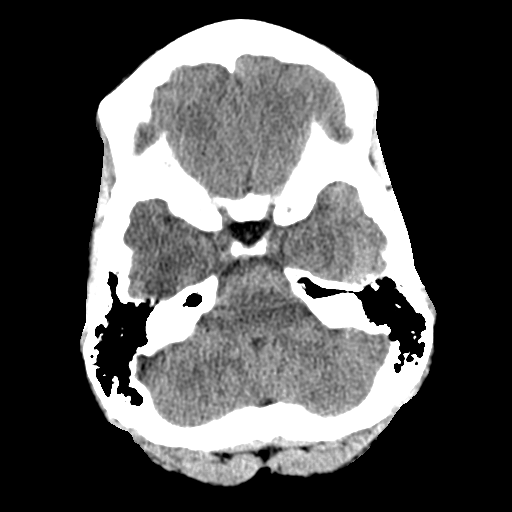
[im 8/32  brain]
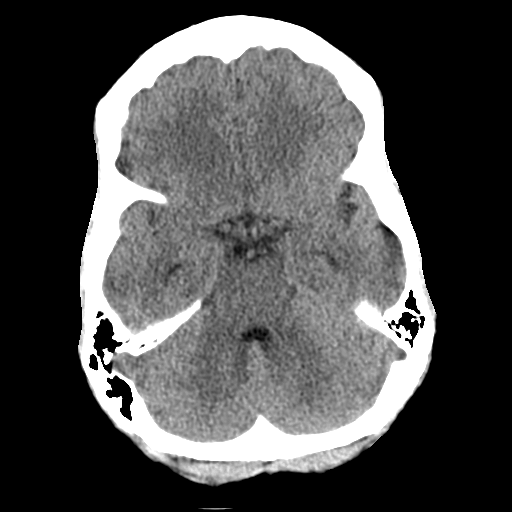
[im 11/32  brain]
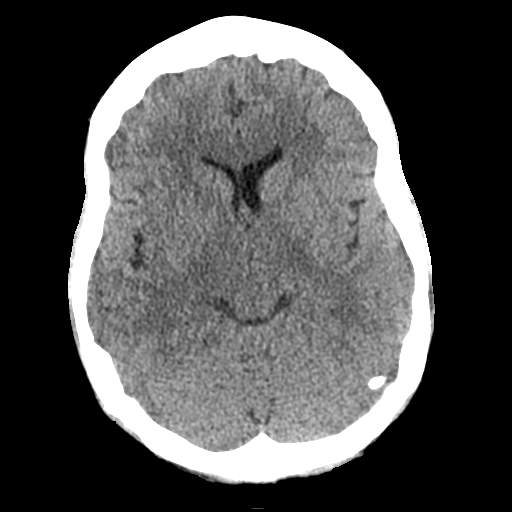
[im 13/32  brain]
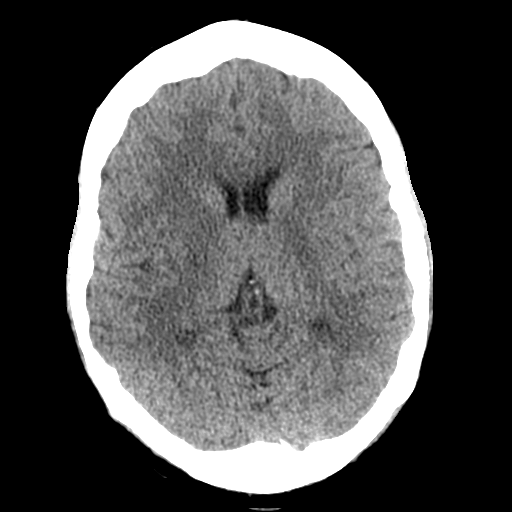
[im 13/32  bone]
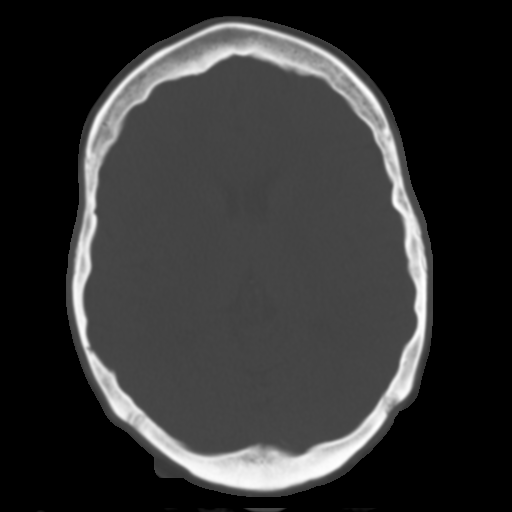
[im 19/32  brain]
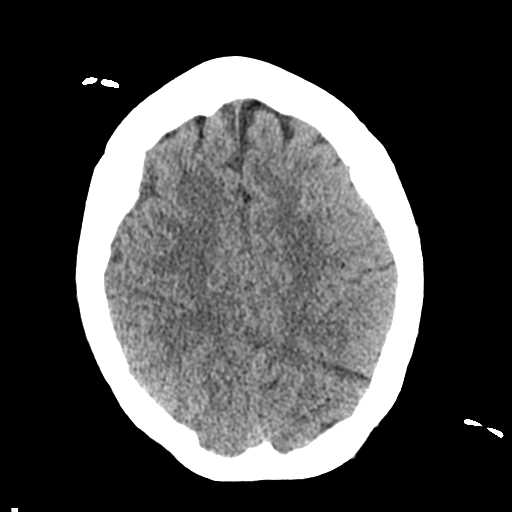
[im 21/32  brain]
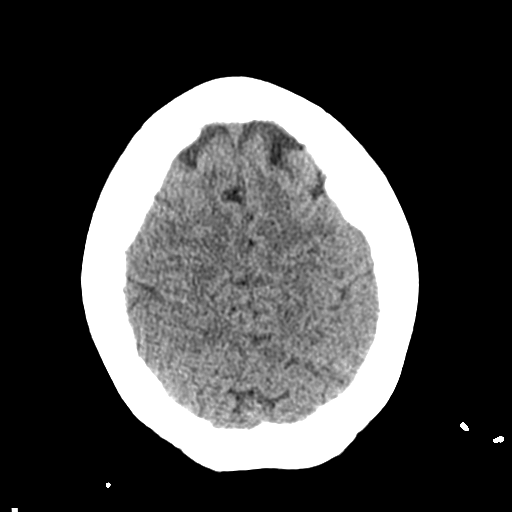
[im 24/32  brain]
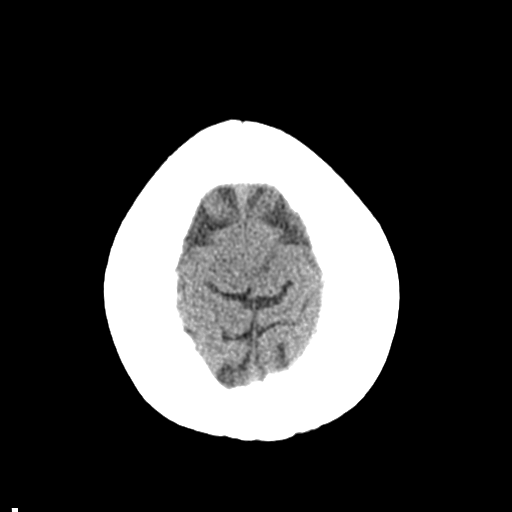
[im 26/32  brain]
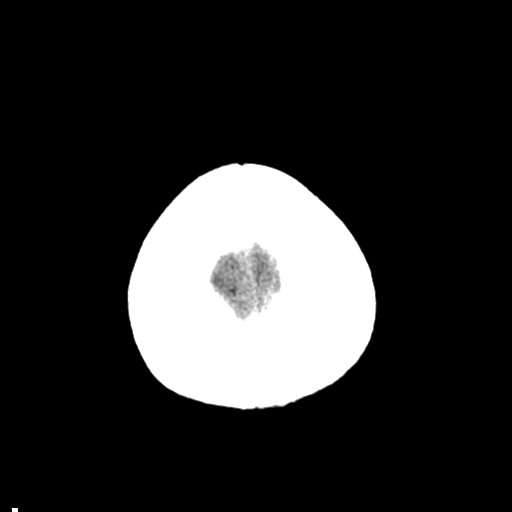
[im 26/32  bone]
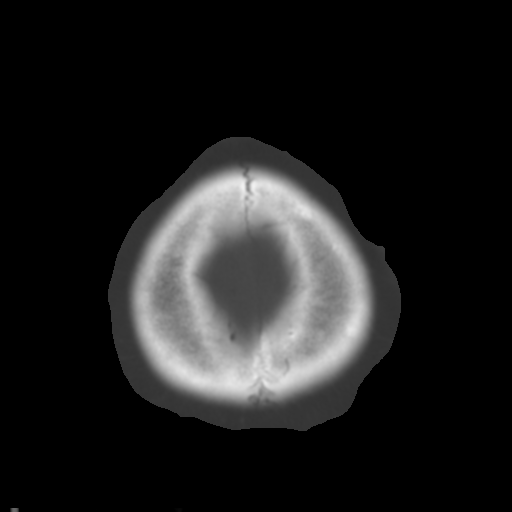
[im 29/32  brain]
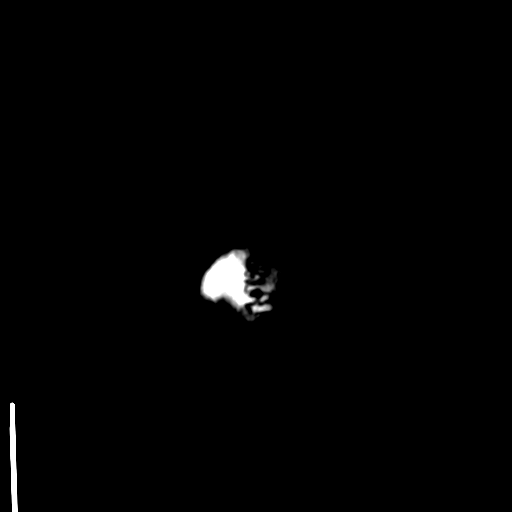

[Series 5: coronal soft tissue · coronal · 0.30mm/px · 3 of 66 slices shown]
[im 22/66  brain]
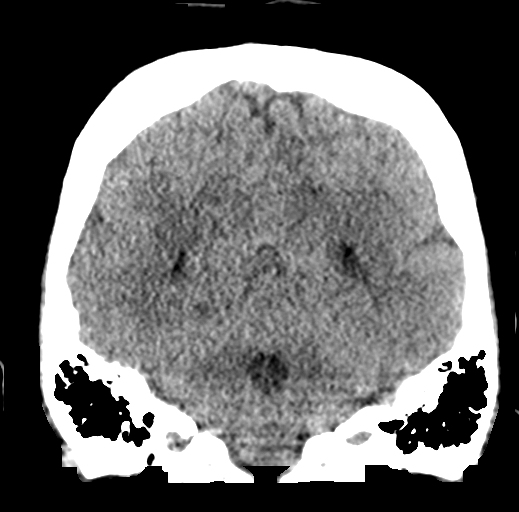
[im 29/66  brain]
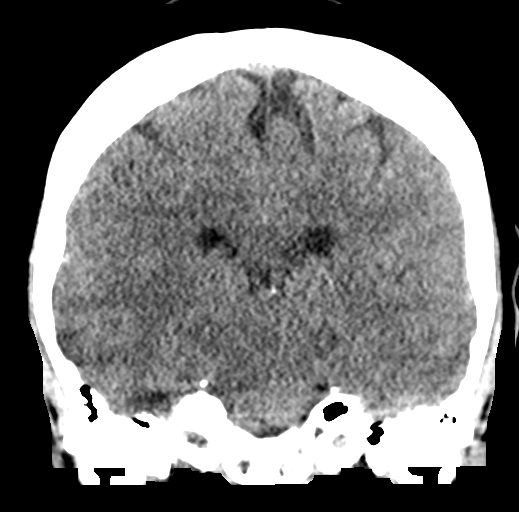
[im 37/66  brain]
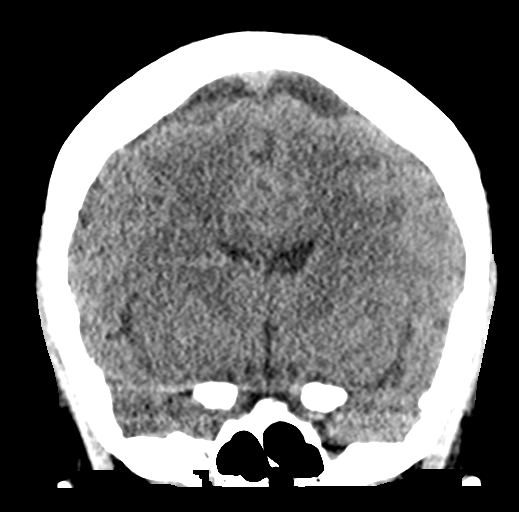

[Series 6: sagittal soft tissue · sagittal · 0.30mm/px · 3 of 53 slices shown]
[im 18/53  brain]
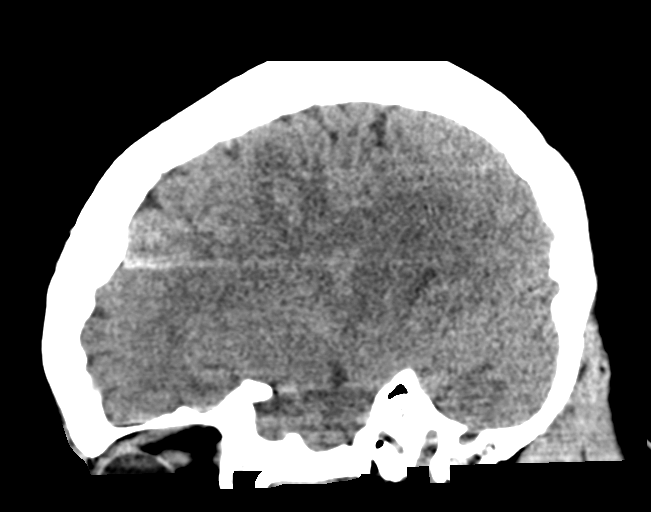
[im 27/53  brain]
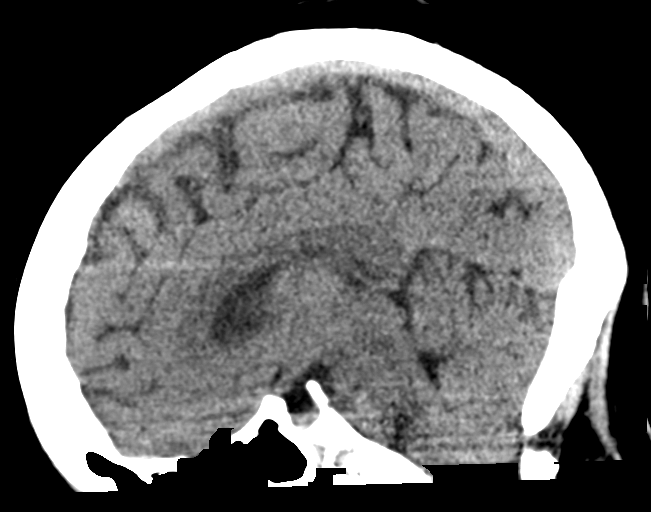
[im 35/53  brain]
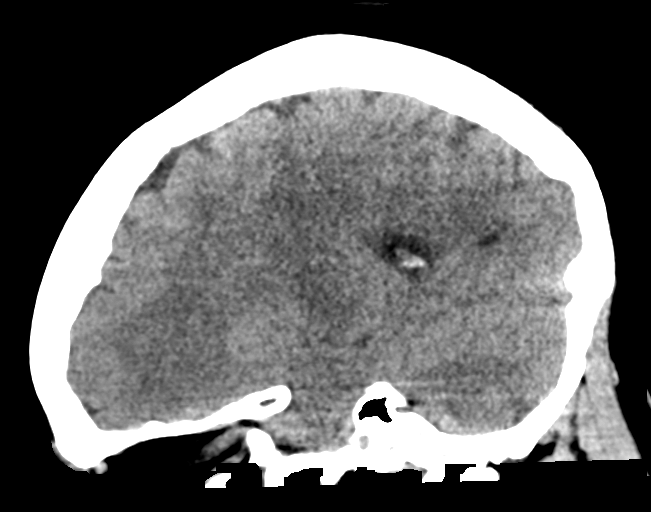

[16 of 47 positions shown; findings below may reference images not displayed]

FINDINGS: Brain: No evidence of acute infarction, hemorrhage, hydrocephalus,
extra-axial collection or mass lesion/mass effect.

Vascular: No hyperdense vessel or unexpected calcification.

Skull: Normal. Negative for fracture or focal lesion.

Sinuses/Orbits: No acute finding.

Other: None.
IMPRESSION: No acute intracranial findings.

## 2020-08-13 IMAGING — MR MR ORBITS WO/W CM
6 series · 41 of 48 positions shown · IV contrast (gadavist)
Comparison: None.

CLINICAL DATA: Monocular vision loss

EXAM:
MRI HEAD AND ORBITS WITHOUT AND WITH CONTRAST
TECHNIQUE: Multiplanar, multiecho pulse sequences of the brain and surrounding
structures were obtained without and with intravenous contrast.
Multiplanar, multiecho pulse sequences of the orbits and surrounding
structures were obtained including fat saturation techniques, before
and after intravenous contrast administration.
CONTRAST:  10mL GADAVIST GADOBUTROL 1 MMOL/ML IV SOLN

[Series 1: T2 fat-sat · axial · 3.0mm · 0.47mm/px · z∈[-74,-17]mm · 7 of 20 slices shown (1 of 2)]
[im 1/20]
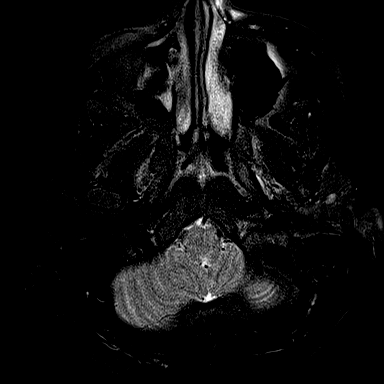
[im 4/20]
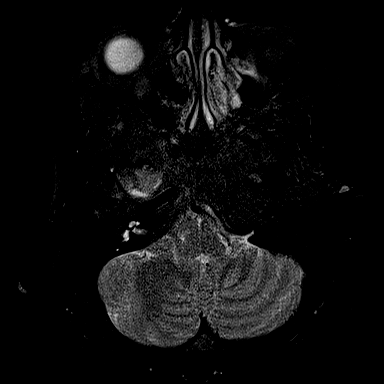
[im 7/20]
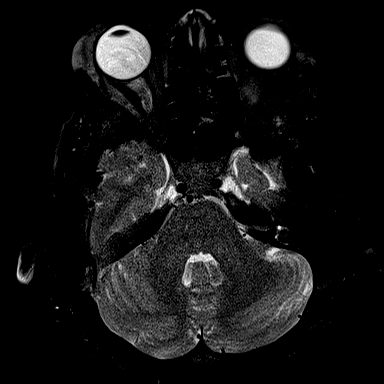
[im 10/20]
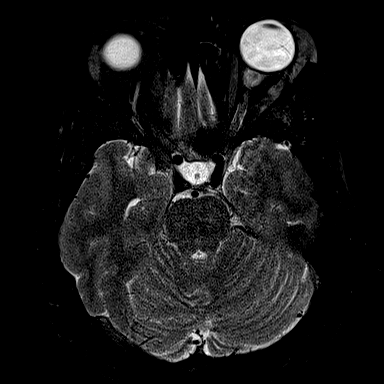
[im 13/20]
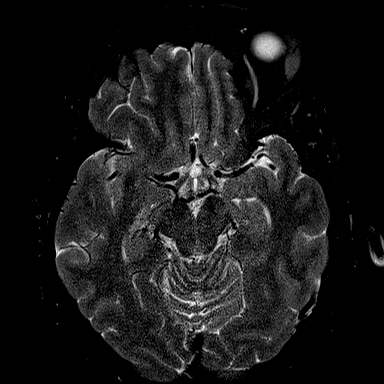
[im 16/20]
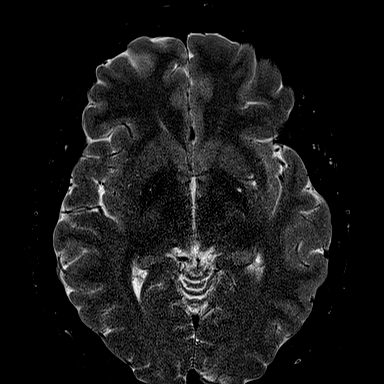
[im 20/20]
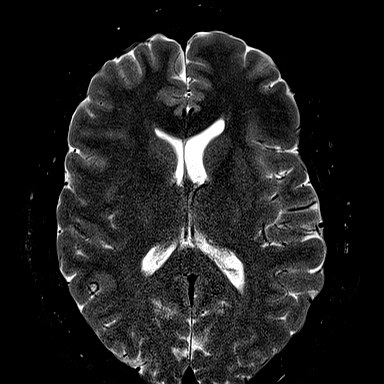

[Series 2: T1 · axial · 3.0mm · 0.56mm/px · z∈[-81,-10]mm · 7 of 24 slices shown (1 of 2)]
[im 1/24]
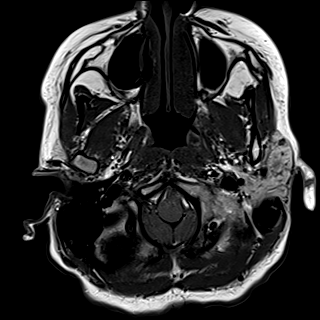
[im 4/24]
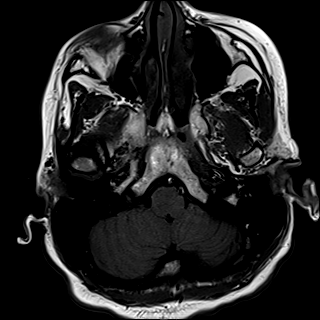
[im 8/24]
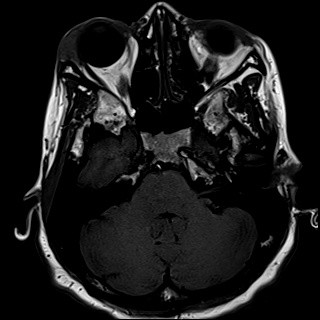
[im 12/24]
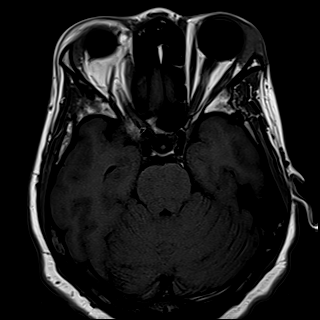
[im 16/24]
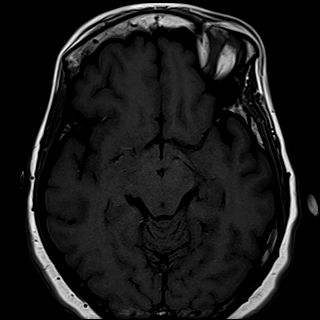
[im 20/24]
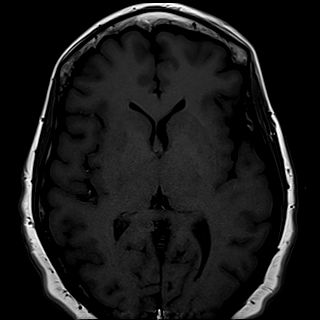
[im 24/24]
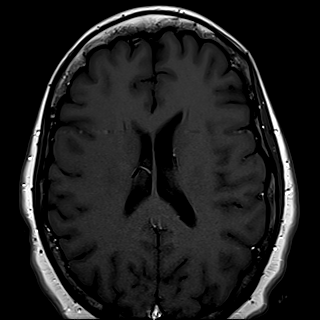

[Series 3: T2 fat-sat · coronal · 3.0mm · 0.47mm/px · 9 of 32 slices shown (2 of 2)]
[im 1/32]
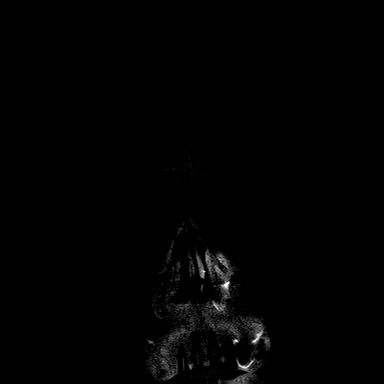
[im 4/32]
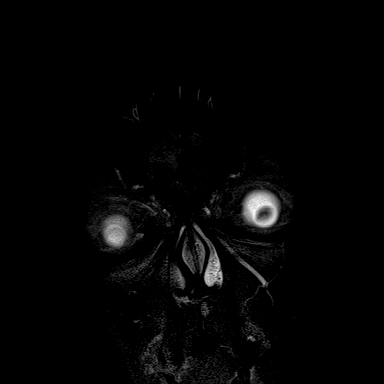
[im 8/32]
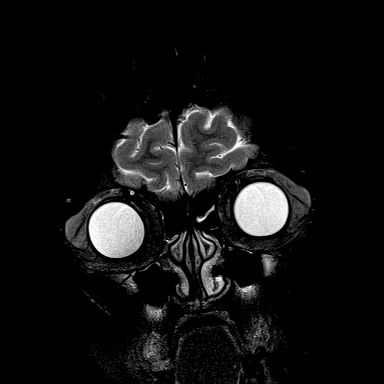
[im 12/32]
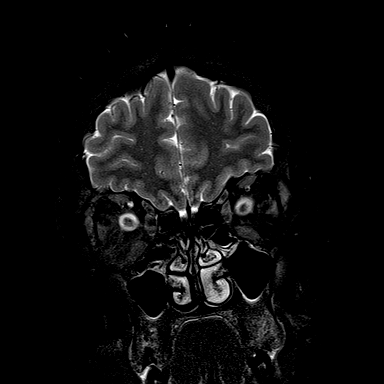
[im 16/32]
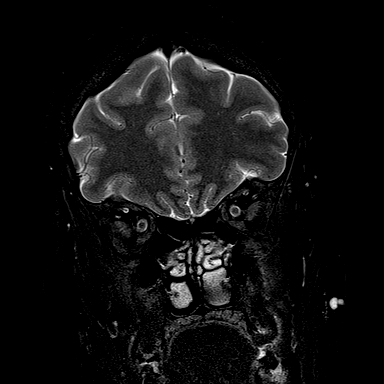
[im 20/32]
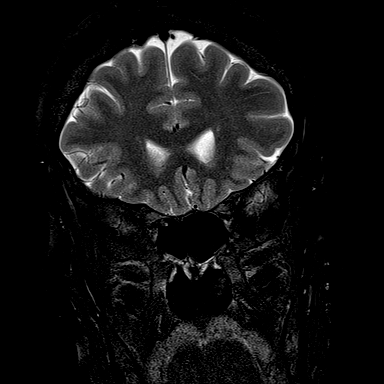
[im 24/32]
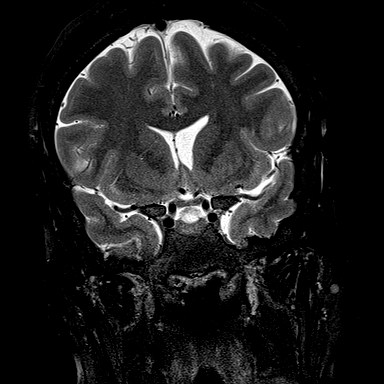
[im 28/32]
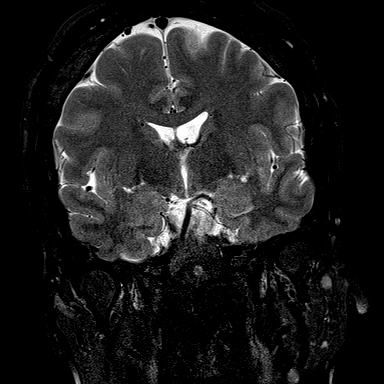
[im 32/32]
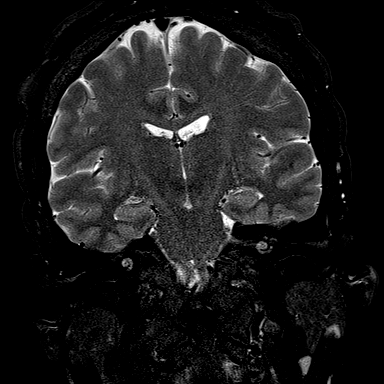

[Series 4: T1 · coronal · 3.0mm · 0.56mm/px · 9 of 32 slices shown (2 of 2)]
[im 1/32]
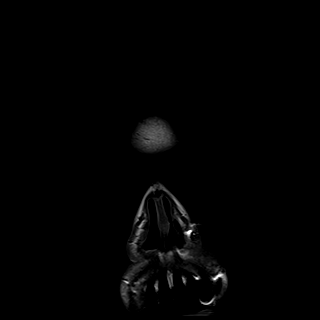
[im 4/32]
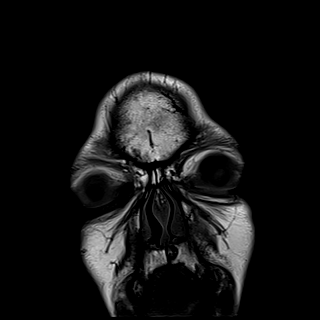
[im 8/32]
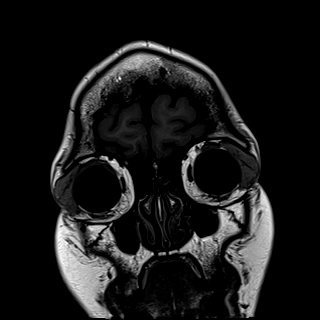
[im 12/32]
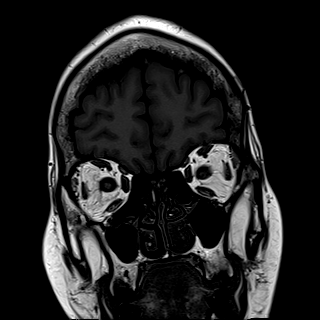
[im 16/32]
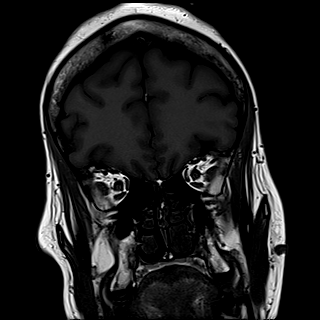
[im 20/32]
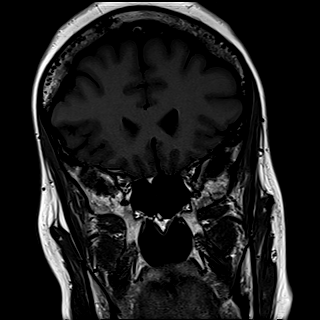
[im 24/32]
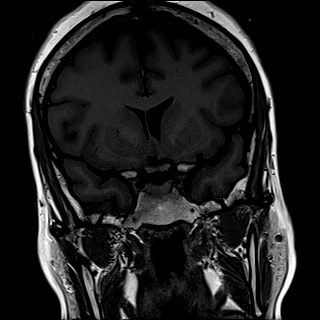
[im 28/32]
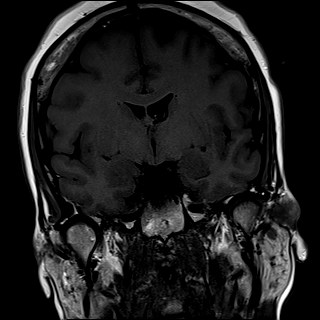
[im 32/32]
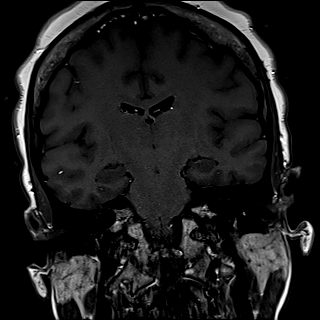

[Series 5: T1 fat-sat post-contrast · axial · 3.0mm · 0.56mm/px · z∈[-81,-10]mm · 7 of 24 slices shown (1 of 2)]
[im 1/24]
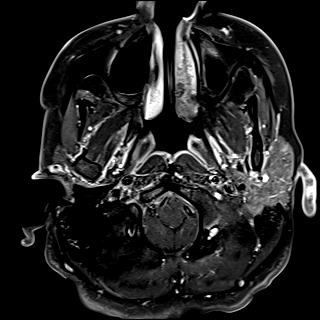
[im 4/24]
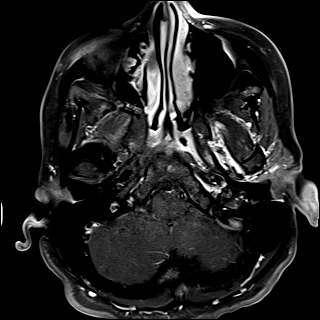
[im 8/24]
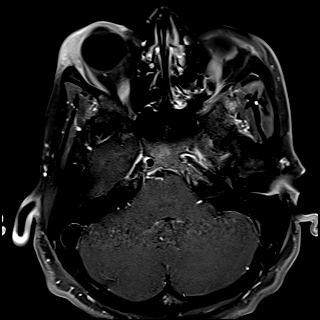
[im 12/24]
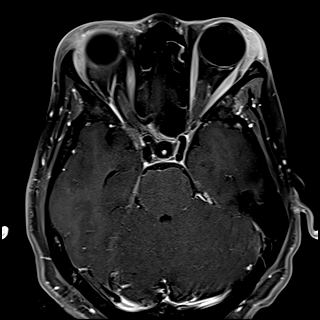
[im 16/24]
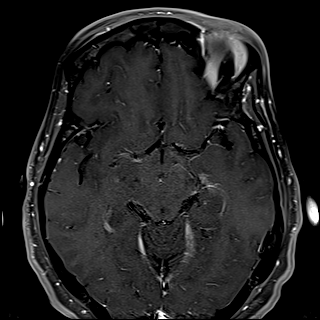
[im 20/24]
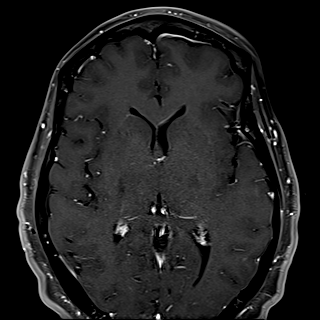
[im 24/24]
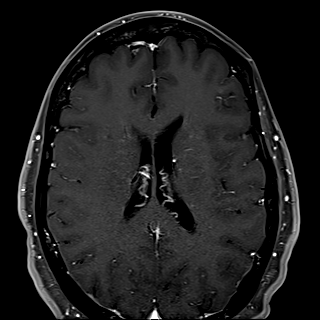

[Series 6: T1 fat-sat post-contrast · coronal · 3.0mm · 0.70mm/px · 2 of 32 slices shown (2 of 2)]
[im 1/32]
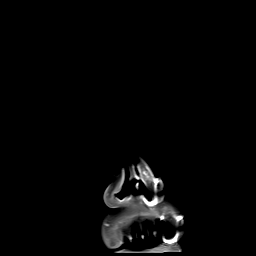
[im 4/32]
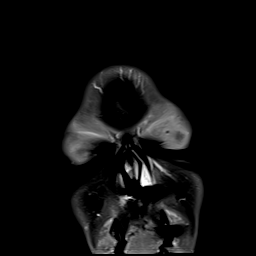

[41 of 48 positions shown; findings below may reference images not displayed]

FINDINGS: MRI HEAD FINDINGS

Brain: No acute infarct, mass effect or extra-axial collection. No
acute or chronic hemorrhage. Normal white matter signal, parenchymal
volume and CSF spaces. The midline structures are normal. There is
no abnormal contrast enhancement.

Vascular: Major flow voids are preserved.

Skull and upper cervical spine: Normal calvarium and skull base.
Visualized upper cervical spine and soft tissues are normal.

Sinuses/Orbits:No paranasal sinus fluid levels or advanced mucosal
thickening. No mastoid or middle ear effusion. Normal orbits.

MRI ORBITS FINDINGS

Orbits: No traumatic or inflammatory finding. Globes, optic nerves,
orbital fat, extraocular muscles, vascular structures, and lacrimal
glands are normal.

Visualized sinuses: Clear.

Soft tissues: Negative.
IMPRESSION: Normal MRI of the brain and orbits.

## 2020-08-13 IMAGING — CT CT ABD-PELV W/ CM
2 of 4 series · 17 of 46 positions shown, 19 images · IV contrast (omnipaque)
Comparison: [DATE] and [DATE]

CLINICAL DATA: Abdominal pain and constipation for 2 weeks. Crohn
disease.

EXAM:
CT ABDOMEN AND PELVIS WITH CONTRAST
TECHNIQUE: Multidetector CT imaging of the abdomen and pelvis was performed
using the standard protocol following bolus administration of
intravenous contrast.
CONTRAST:  80mL OMNIPAQUE IOHEXOL 350 MG/ML SOLN

[Series 2: axial st · axial · 0.79mm/px · z∈[-486,-66]mm · 14 of 96 slices shown, 16 images]
[im 6/96  soft-tissue]
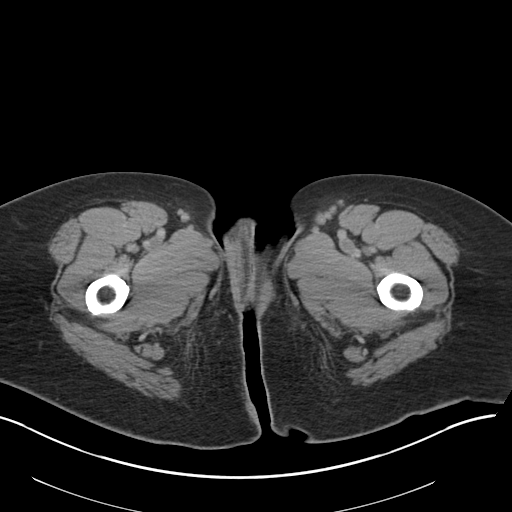
[im 6/96  bone]
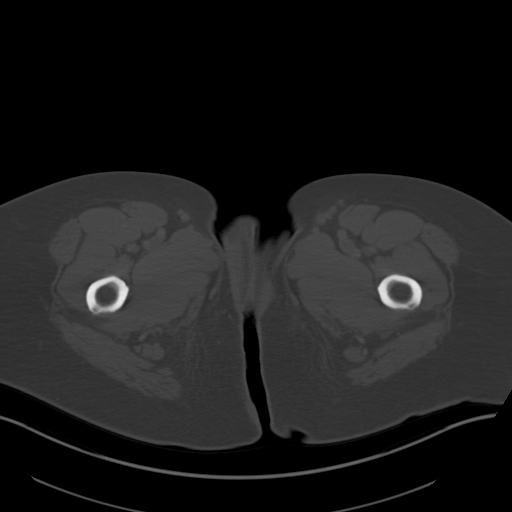
[im 11/96  soft-tissue]
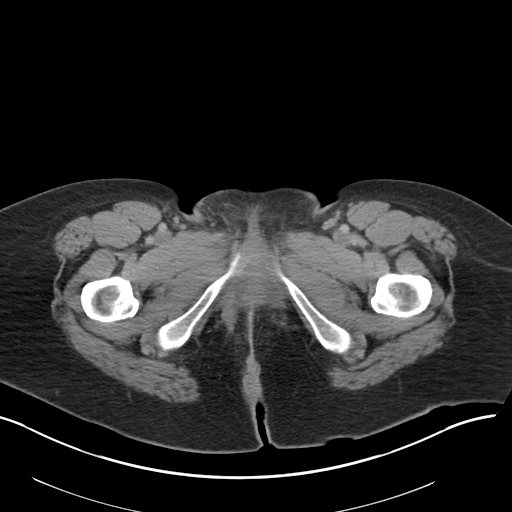
[im 22/96  soft-tissue]
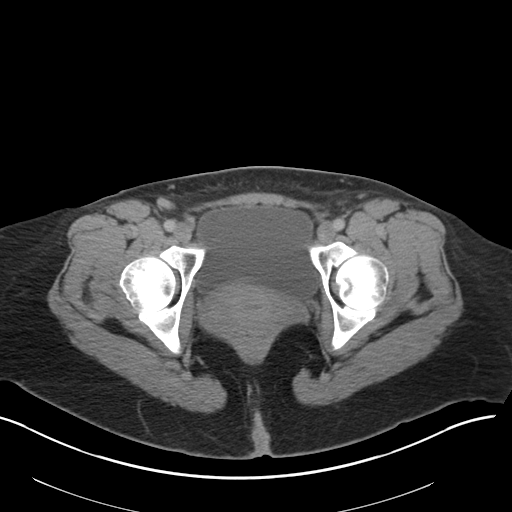
[im 27/96  soft-tissue]
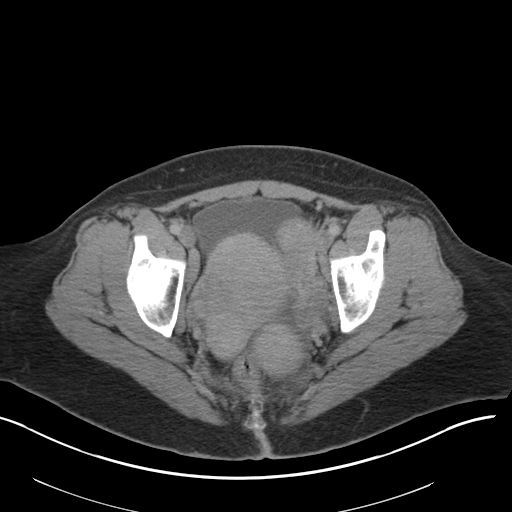
[im 32/96  soft-tissue]
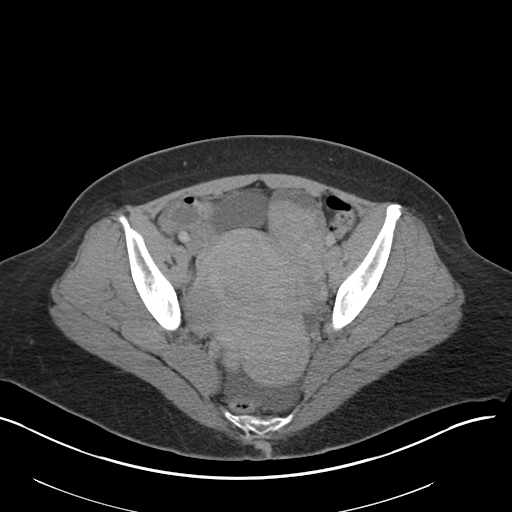
[im 37/96  soft-tissue]
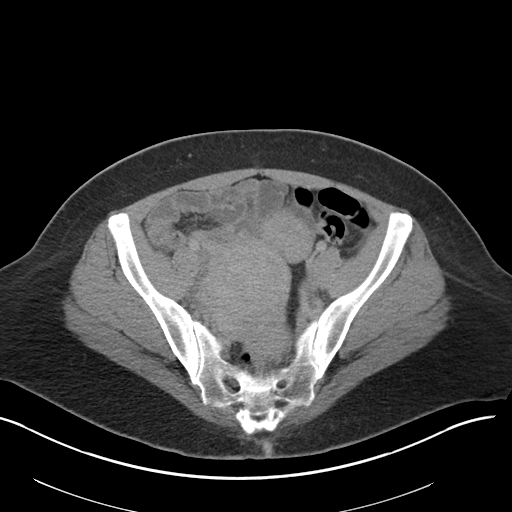
[im 43/96  soft-tissue]
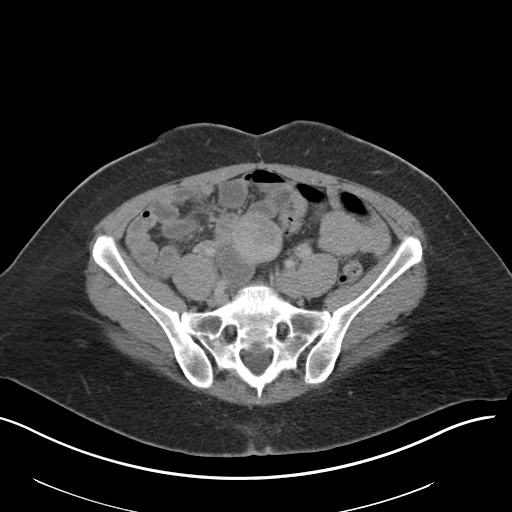
[im 53/96  soft-tissue]
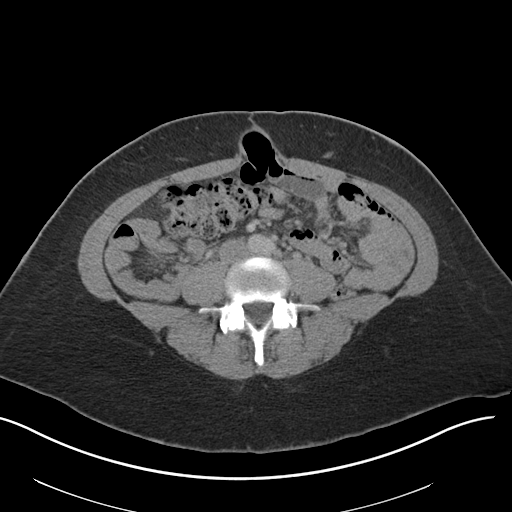
[im 59/96  soft-tissue]
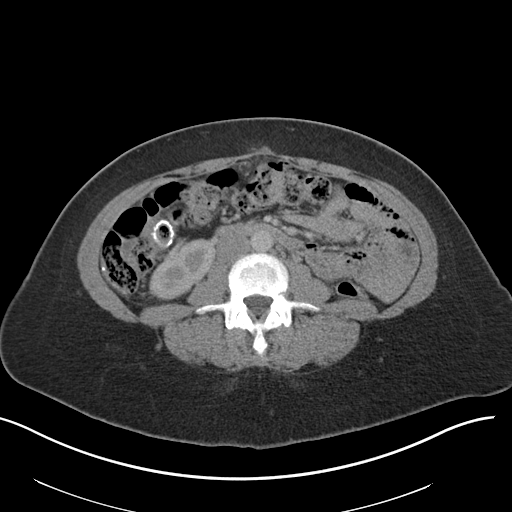
[im 59/96  bone]
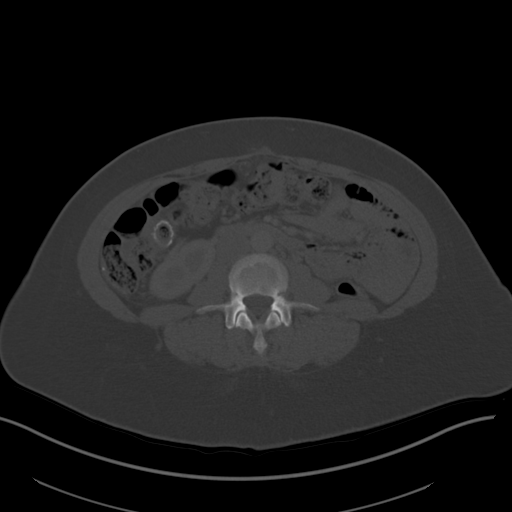
[im 64/96  soft-tissue]
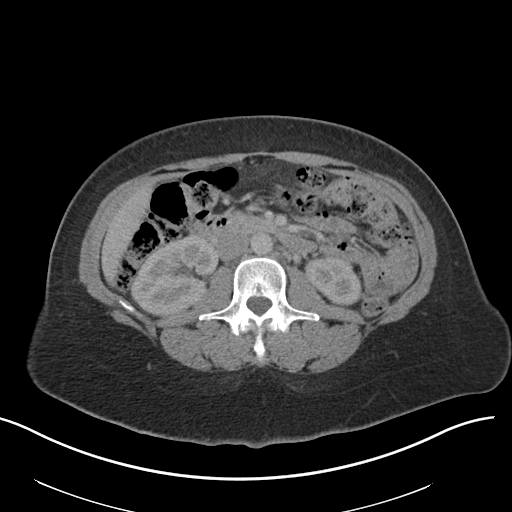
[im 69/96  soft-tissue]
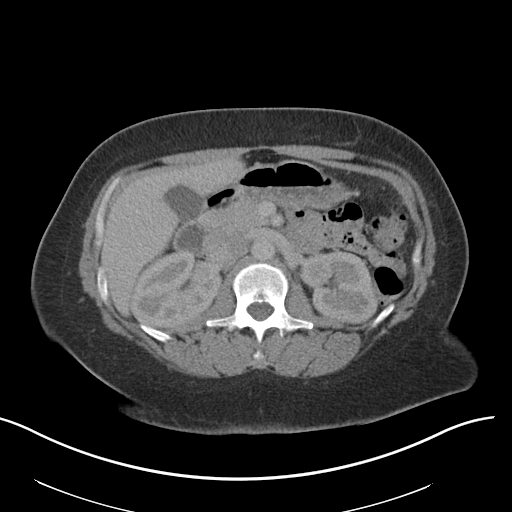
[im 74/96  soft-tissue]
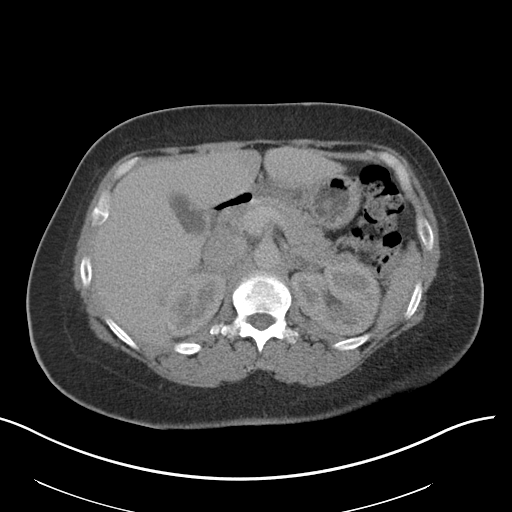
[im 85/96  soft-tissue]
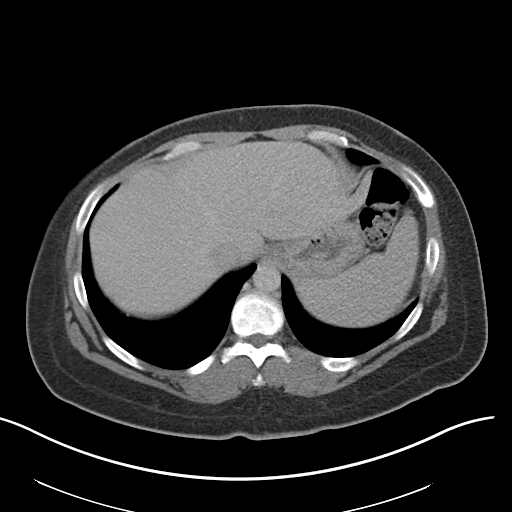
[im 90/96  soft-tissue]
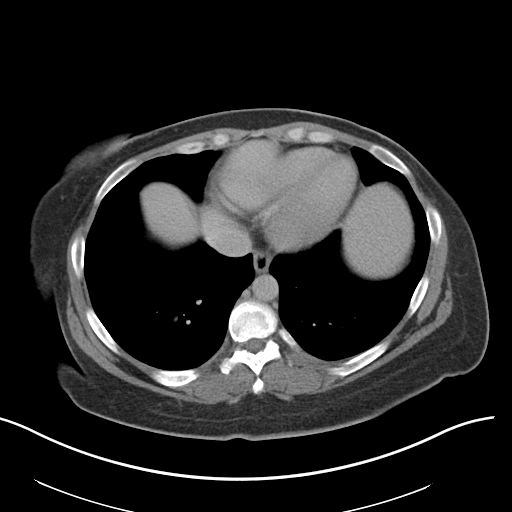

[Series 4: coronal st · coronal · 0.90mm/px · 3 of 142 slices shown]
[im 48/142  soft-tissue]
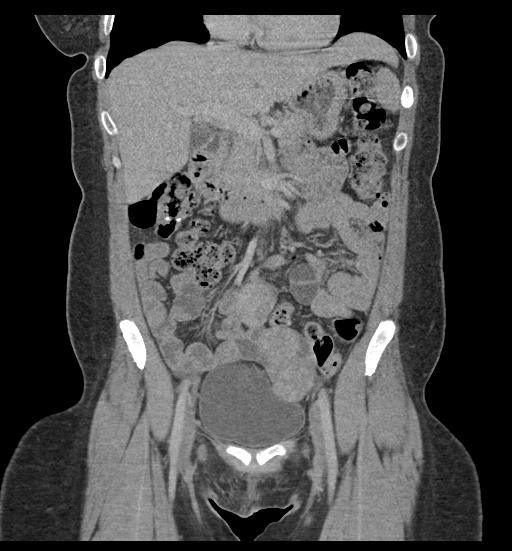
[im 63/142  soft-tissue]
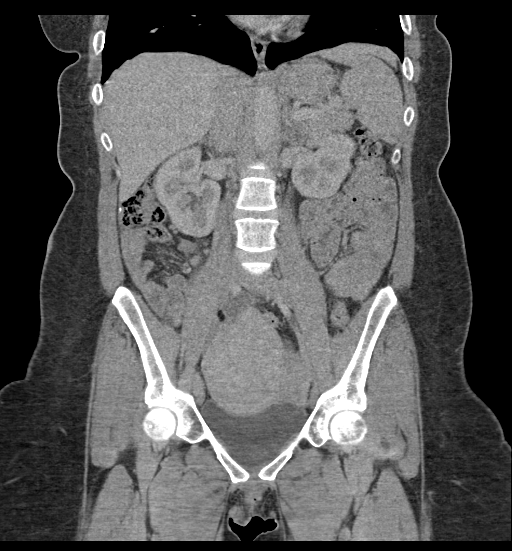
[im 79/142  soft-tissue]
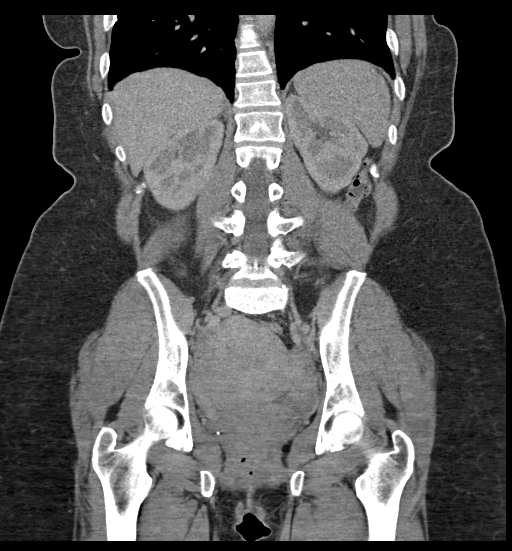

[17 of 46 positions shown; findings below may reference images not displayed]

FINDINGS: Lower Chest: No acute findings.

Hepatobiliary: No hepatic masses identified. Gallbladder is
unremarkable. No evidence of biliary ductal dilatation.

Pancreas:  No mass or inflammatory changes.

Spleen: Within normal limits in size and appearance.

Adrenals/Urinary Tract: No masses identified. No evidence of
ureteral calculi or hydronephrosis.

Stomach/Bowel: Previously seen distal small bowel wall thickening is
no longer visualized. No evidence of obstruction, inflammatory
process or abnormal fluid collections. A small paraumbilical ventral
hernia is now seen which contains a small bowel loop, however there
is no evidence of bowel obstruction or strangulation.

Vascular/Lymphatic: No pathologically enlarged lymph nodes. No acute
vascular findings.

Reproductive: Enlarged uterus is again seen which contains multiple
fibroids, largest posteriorly measuring approximately 6 cm, without
significant change compared to prior studies. Tiny amount of free
fluid noted in pelvic cul-de-sac.

Other:  None.

Musculoskeletal:  No suspicious bone lesions identified.
IMPRESSION: New small paraumbilical ventral hernia containing a small bowel
loop. No evidence of bowel obstruction or strangulation. No other
acute findings.

Stable enlarged fibroid uterus.

## 2020-08-13 IMAGING — MR MR HEAD WO/W CM
16 series · 48 of 48 positions shown · IV contrast (gadavist)
Comparison: None.

CLINICAL DATA: Monocular vision loss

EXAM:
MRI HEAD AND ORBITS WITHOUT AND WITH CONTRAST
TECHNIQUE: Multiplanar, multiecho pulse sequences of the brain and surrounding
structures were obtained without and with intravenous contrast.
Multiplanar, multiecho pulse sequences of the orbits and surrounding
structures were obtained including fat saturation techniques, before
and after intravenous contrast administration.
CONTRAST:  10mL GADAVIST GADOBUTROL 1 MMOL/ML IV SOLN

[Series 9: DWI · axial · 3.0mm · 1.36mm/px · z∈[-96,+45]mm · 6 of 104 slices shown (1 of 2)]
[im 1/104]
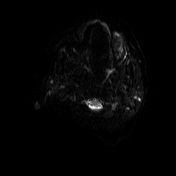
[im 21/104]
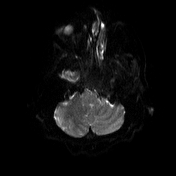
[im 42/104]
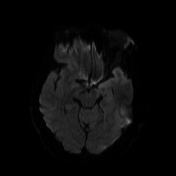
[im 62/104]
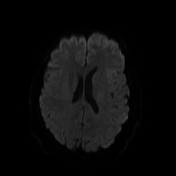
[im 83/104]
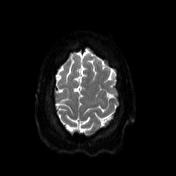
[im 104/104]
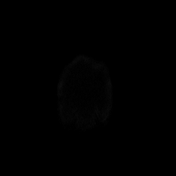

[Series 10: DWI · axial · 3.0mm · 1.36mm/px · z∈[-96,+45]mm · 3 of 52 slices shown (2 of 2)]
[im 1/52]
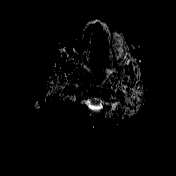
[im 26/52]
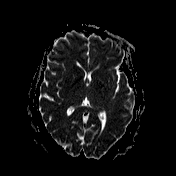
[im 52/52]
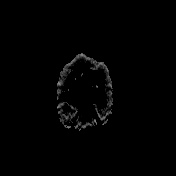

[Series 11: T1 · sagittal · 5.0mm · 0.75mm/px · 1 of 26 slices shown (1 of 4)]
[im 1/26]
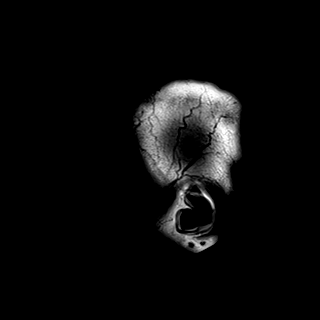

[Series 12: T2 · axial · 5.0mm · 0.62mm/px · 1 of 26 slices shown]
[im 1/26]
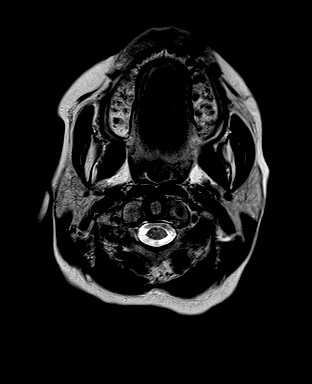

[Series 13: swi_images · axial · 3.0mm · 0.75mm/px · z∈[-139,+56]mm · 4 of 72 slices shown]
[im 1/72]
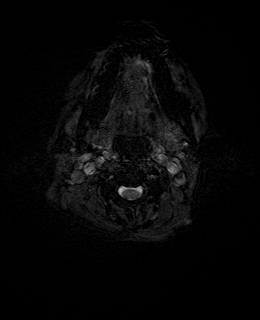
[im 24/72]
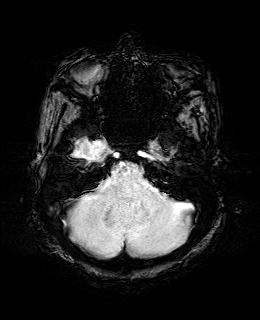
[im 48/72]
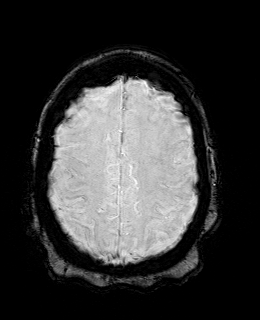
[im 72/72]
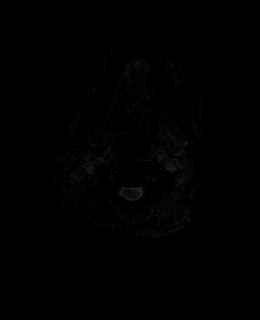

[Series 14: mip_images(sw) · axial · 24.0mm · 0.75mm/px · z∈[-129,+47]mm · 3 of 65 slices shown]
[im 1/65]
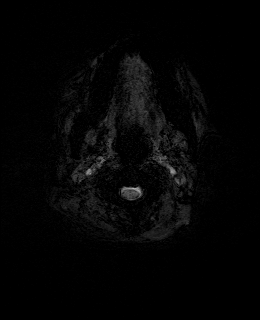
[im 33/65]
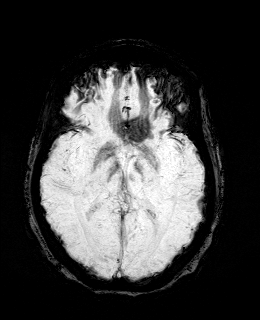
[im 65/65]
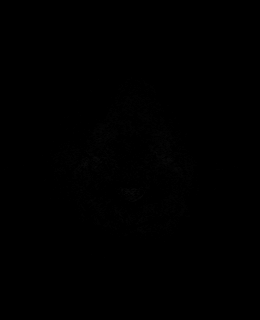

[Series 15: FLAIR · axial · 3.0mm · 0.75mm/px · z∈[-111,+29]mm · 3 of 52 slices shown]
[im 1/52]
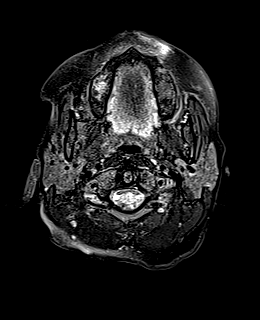
[im 26/52]
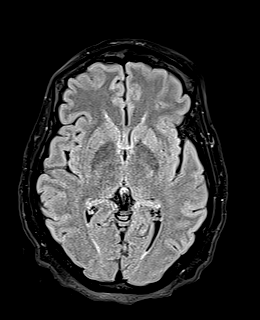
[im 52/52]
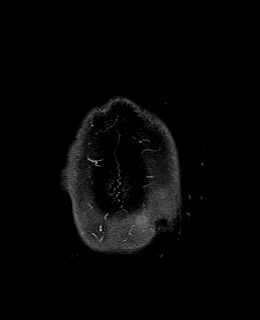

[Series 16: T1 · axial · 1.0mm · 0.94mm/px · z∈[-112,+34]mm · 8 of 160 slices shown (2 of 4)]
[im 1/160]
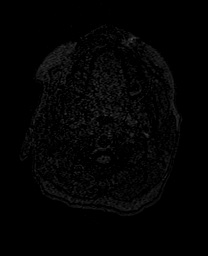
[im 23/160]
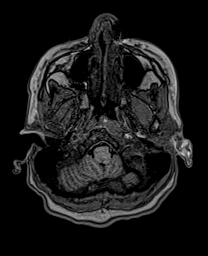
[im 46/160]
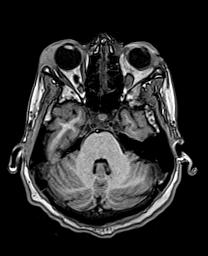
[im 69/160]
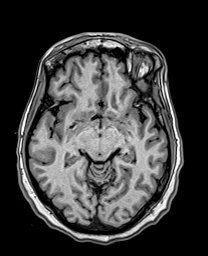
[im 91/160]
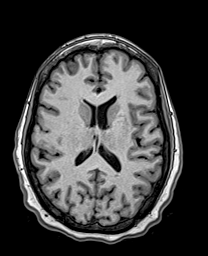
[im 114/160]
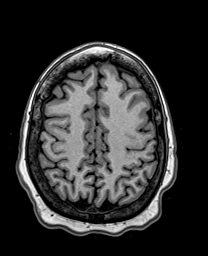
[im 137/160]
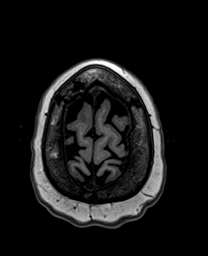
[im 160/160]
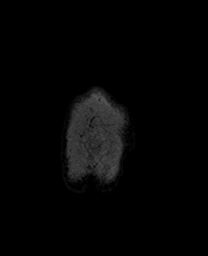

[Series 17: cor dwi_tracew · coronal · 5.0mm · 1.53mm/px · 3 of 60 slices shown]
[im 1/60]
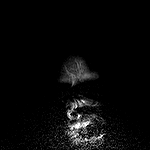
[im 30/60]
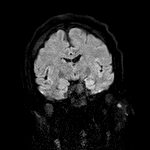
[im 60/60]
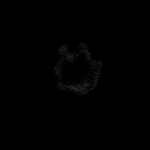

[Series 18: cor dwi_adc · coronal · 5.0mm · 1.53mm/px · 1 of 29 slices shown]
[im 1/29]
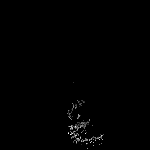

[Series 23: T1 post-contrast · axial · 1.0mm · 0.94mm/px · z∈[-112,+34]mm · 8 of 160 slices shown (1 of 3)]
[im 1/160]
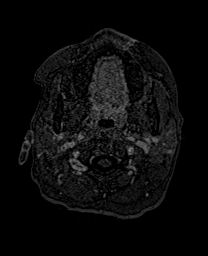
[im 23/160]
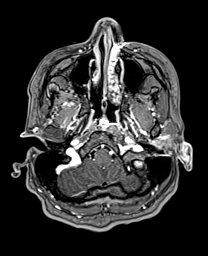
[im 46/160]
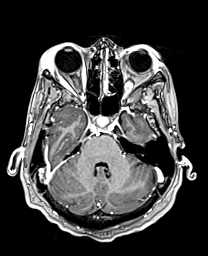
[im 69/160]
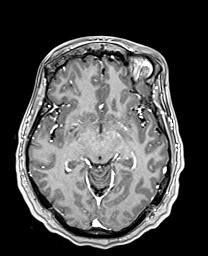
[im 91/160]
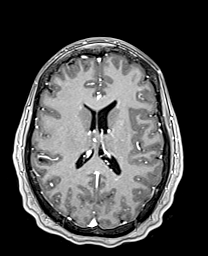
[im 114/160]
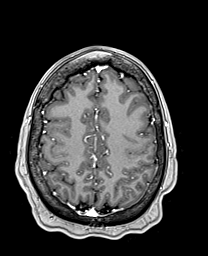
[im 137/160]
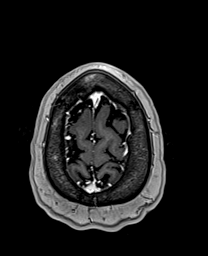
[im 160/160]
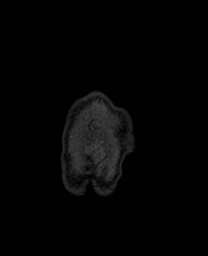

[Series 24: T1 · sagittal · 4.0mm · 0.94mm/px · 2 of 36 slices shown (3 of 4)]
[im 1/36]
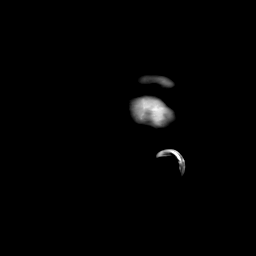
[im 36/36]
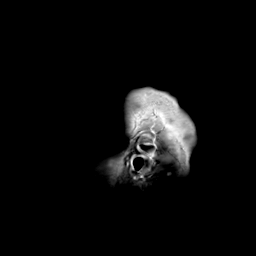

[Series 25: T1 · coronal · 4.0mm · 0.94mm/px · 2 of 45 slices shown (4 of 4)]
[im 1/45]
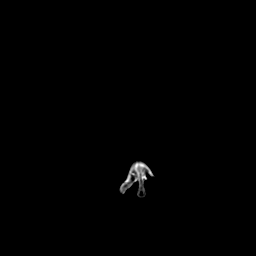
[im 45/45]
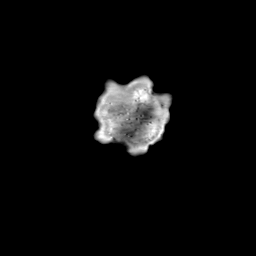

[Series 26: T2 post-contrast · coronal · 5.0mm · 0.57mm/px · 1 of 30 slices shown]
[im 1/30]
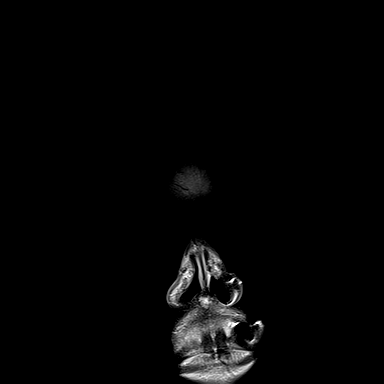

[Series 27: T1 post-contrast · coronal · 5.0mm · 0.43mm/px · 1 of 30 slices shown (2 of 3)]
[im 1/30]
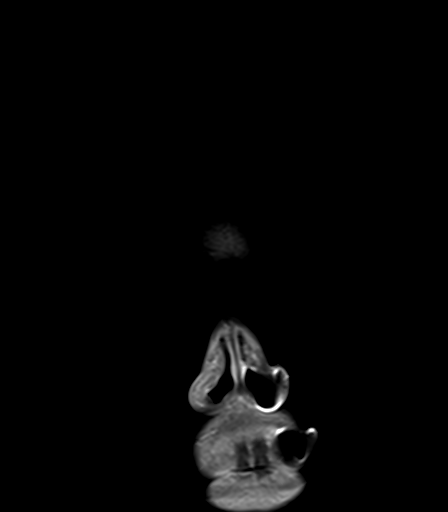

[Series 28: T1 post-contrast · sagittal · 5.0mm · 0.75mm/px · 1 of 26 slices shown (3 of 3)]
[im 1/26]
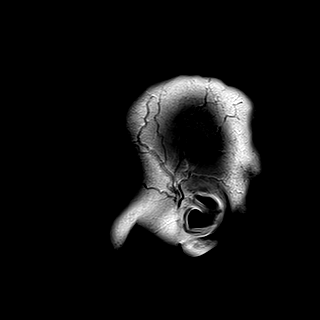

[48 of 48 positions shown; findings below may reference images not displayed]

FINDINGS: MRI HEAD FINDINGS

Brain: No acute infarct, mass effect or extra-axial collection. No
acute or chronic hemorrhage. Normal white matter signal, parenchymal
volume and CSF spaces. The midline structures are normal. There is
no abnormal contrast enhancement.

Vascular: Major flow voids are preserved.

Skull and upper cervical spine: Normal calvarium and skull base.
Visualized upper cervical spine and soft tissues are normal.

Sinuses/Orbits:No paranasal sinus fluid levels or advanced mucosal
thickening. No mastoid or middle ear effusion. Normal orbits.

MRI ORBITS FINDINGS

Orbits: No traumatic or inflammatory finding. Globes, optic nerves,
orbital fat, extraocular muscles, vascular structures, and lacrimal
glands are normal.

Visualized sinuses: Clear.

Soft tissues: Negative.
IMPRESSION: Normal MRI of the brain and orbits.

## 2020-08-13 IMAGING — CR DG FOOT COMPLETE 3+V*R*
3 series · 3 of 3 positions shown · non-contrast
Comparison: None.

CLINICAL DATA: Fall, right great toe pain

EXAM:
RIGHT FOOT COMPLETE - 3+ VIEW

[x foot ap right]
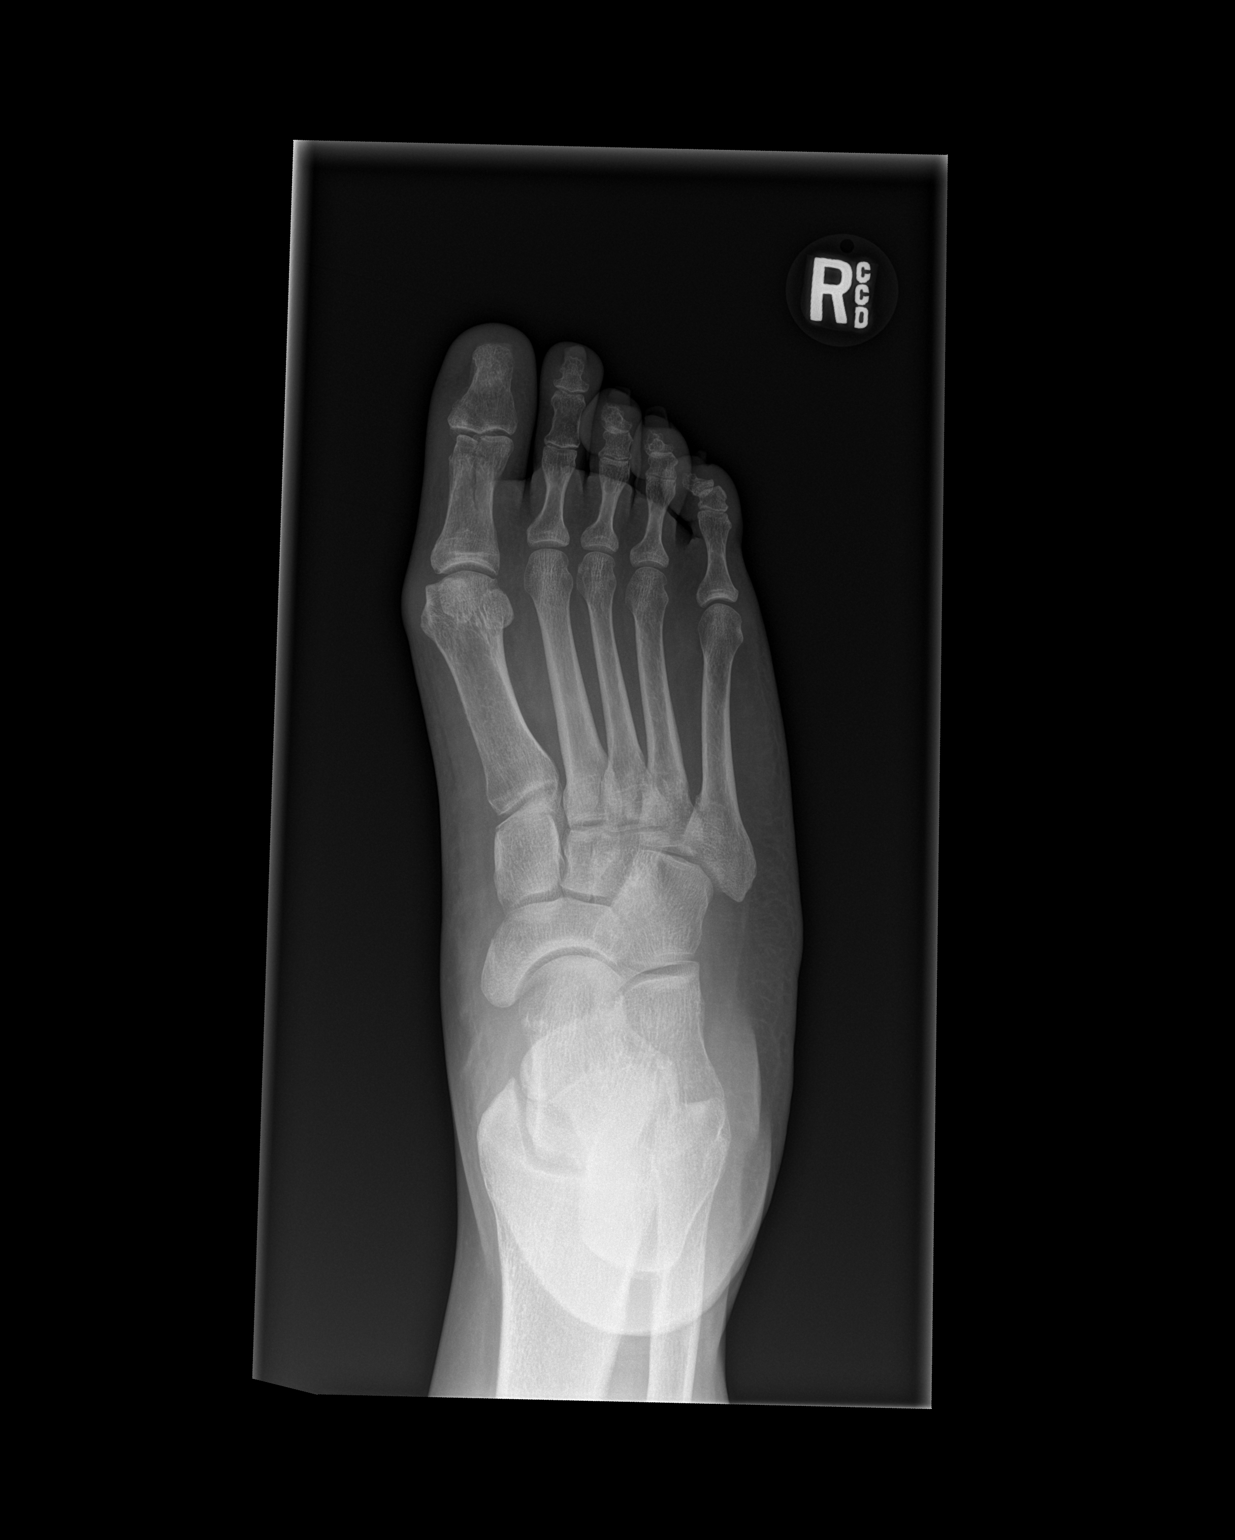

[x foot obl right]
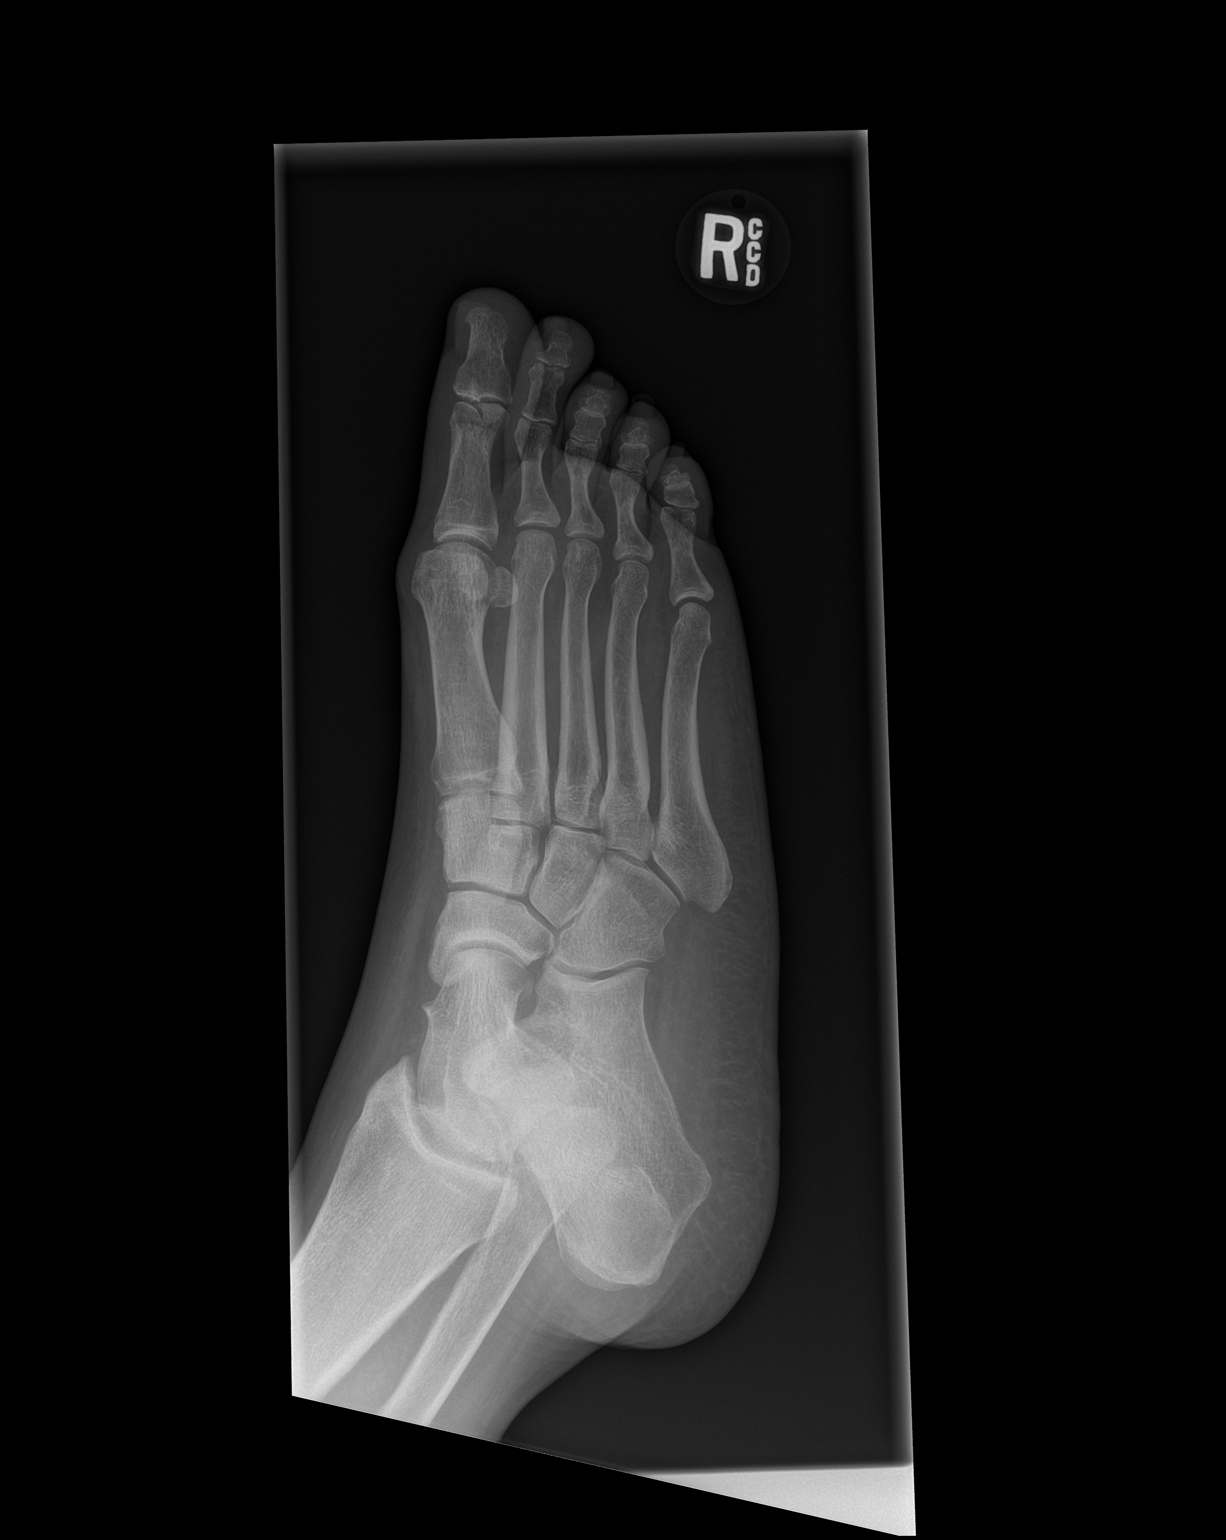

[x foot lat right]
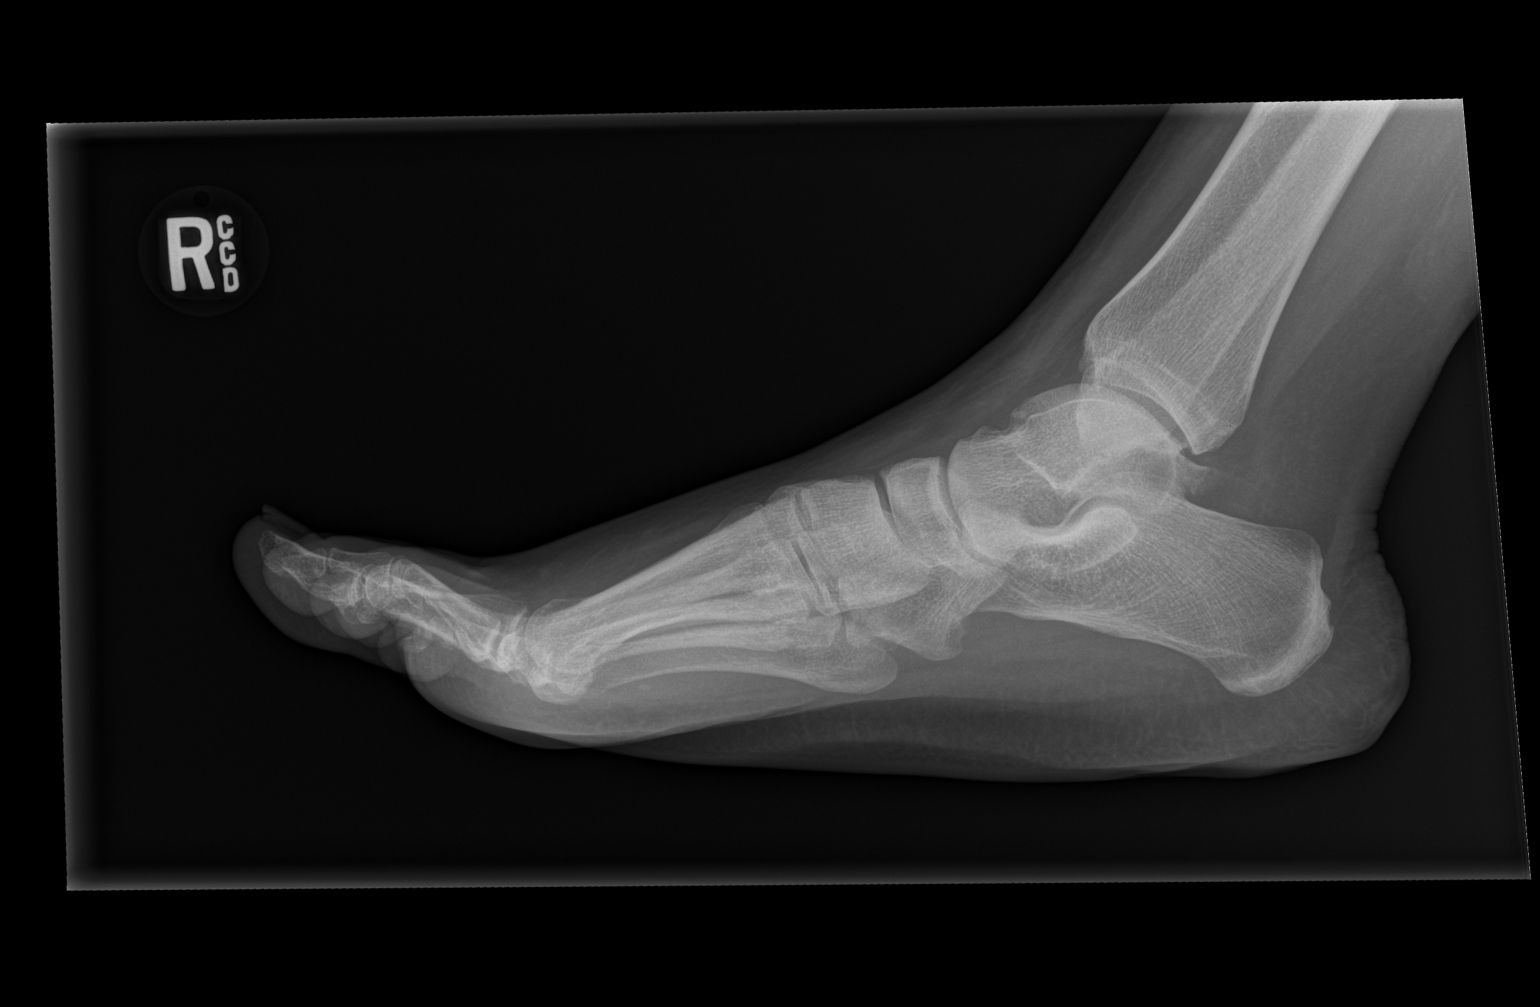

[3 of 3 positions shown; findings below may reference images not displayed]

FINDINGS: Acute nondisplaced longitudinally oriented fracture of the great toe
proximal phalanx with intra-articular extension to the
interphalangeal joint. Fracture extends proximally to the level of
the mid diaphysis of the proximal phalanx. No dislocation. No
additional fractures. Soft tissues within normal limits.
IMPRESSION: Acute nondisplaced intra-articular fracture of the great toe
proximal phalanx.

## 2020-08-13 MED ORDER — DIPHENHYDRAMINE HCL 50 MG/ML IJ SOLN
12.5000 mg | Freq: Once | INTRAMUSCULAR | Status: AC
  Administered 2020-08-13: 12.5 mg via INTRAVENOUS
  Filled 2020-08-13: qty 1

## 2020-08-13 MED ORDER — SODIUM CHLORIDE 0.9 % IV BOLUS
1000.0000 mL | Freq: Once | INTRAVENOUS | Status: AC
Start: 2020-08-13 — End: 2020-08-13
  Administered 2020-08-13: 1000 mL via INTRAVENOUS

## 2020-08-13 MED ORDER — GADOBUTROL 1 MMOL/ML IV SOLN
10.0000 mL | Freq: Once | INTRAVENOUS | Status: AC | PRN
  Administered 2020-08-13: 10 mL via INTRAVENOUS

## 2020-08-13 MED ORDER — KETOROLAC TROMETHAMINE 15 MG/ML IJ SOLN: 15.0000 mg | Freq: Once | INTRAMUSCULAR | Status: DC

## 2020-08-13 MED ORDER — PROCHLORPERAZINE EDISYLATE 10 MG/2ML IJ SOLN
10.0000 mg | Freq: Once | INTRAMUSCULAR | Status: AC
  Administered 2020-08-13: 10 mg via INTRAVENOUS
  Filled 2020-08-13: qty 2

## 2020-08-13 MED ORDER — TETRACAINE HCL 0.5 % OP SOLN
2.0000 [drp] | Freq: Once | OPHTHALMIC | Status: AC
  Administered 2020-08-13: 2 [drp] via OPHTHALMIC
  Filled 2020-08-13: qty 4

## 2020-08-13 MED ORDER — IOHEXOL 350 MG/ML SOLN
80.0000 mL | Freq: Once | INTRAVENOUS | Status: AC | PRN
  Administered 2020-08-13: 80 mL via INTRAVENOUS

## 2020-08-13 MED ORDER — KETOROLAC TROMETHAMINE 15 MG/ML IJ SOLN
15.0000 mg | Freq: Once | INTRAMUSCULAR | Status: AC
  Administered 2020-08-13: 15 mg via INTRAVENOUS
  Filled 2020-08-13: qty 1

## 2020-08-13 NOTE — ED Provider Notes (Signed)
[facility] DEPT Provider Note   CSN: [id number] Arrival date & time: 08/13/20  1241     History Chief Complaint  Patient presents with   Headache    Connie Malone is a 40 y.o. female with a past medical history significant for iron deficiency anemia and Crohn's disease who presents to the ED due to multiple complaints.  Patient admits to generalized abdominal pain that has worsened over the past 2 weeks associated with constipation.  Patient states that it feels typical of a Crohn's flare.  She is currently on Humira injections twice monthly.  Last injection was last week.  She has tried MiraLAX, prune juice, and enemas at home with no relief.  She admits to 1 episode of nonbloody, nonbilious emesis.  Denies urinary vaginal symptoms.  History of previous intestinal obstruction.  Previous resection surgery.  No fever or chills.  Denies vaginal and urinary symptoms.  Patient states she was recently treated for STDs and has not had intercourse since.  Patient also endorses a right-sided, throbbing headache x1 day associated with blurry vision of the right eye.  Denies recent head injury.  Denies unilateral weakness, speech changes, dizziness.  No history of migraines. No contact lens or glasses. No erythema or drainage to eye.  Patient also endorses right great toe pain for the past 2.5 weeks.  Patient states she fell 2.5 weeks ago when walking out the door.  No head injury or loss of consciousness.  Patient states she injured her right great toe.  Pain is worse with movement.  Denies numbness/tingling.  History obtained from patient and past medical records. No interpreter used during encounter.       Past Medical History:  Diagnosis Date   Anemia    Bladder mass    Blood transfusion without reported diagnosis    Crohn disease ([place])    Intestinal obstruction ([facility])    Iron deficiency anemia    Microcytic anemia     There are no problems to display  for this patient.   Past Surgical History:  Procedure Laterality Date   CYSTOSCOPY       OB History   No obstetric history on file.     Family History  Family history unknown: Yes    Social History   Tobacco Use   Smoking status: Former   Smokeless tobacco: Never  Scientific laboratory technician Use: Never used  Substance Use Topics   Alcohol use: Yes    Comment: occasional   Drug use: No    Home Medications Prior to Admission medications   Medication Sig Start Date End Date Taking? Authorizing Provider  acetaminophen (TYLENOL) 325 MG tablet Take 650 mg by mouth every 6 (six) hours as needed for mild pain, fever or headache.    [provider]  Adalimumab 40 MG/0.4ML PNKT Inject 0.4 mLs into the skin every 14 (fourteen) days. 07/27/19   [provider]  clindamycin (CLEOCIN) 150 MG capsule Take 1 capsule (150 mg total) by mouth every 6 (six) hours. Patient not taking: No sig reported 09/03/13   Ernestina Patches, MD  Fe Fum-Fe Poly-Vit C-Lactobac (FUSION PO) Take 1 capsule by mouth every morning.    [provider]  ferrous sulfate 325 (65 FE) MG tablet Take 1 tablet by mouth daily. 06/17/17   [provider]  fluticasone (FLONASE) 50 MCG/ACT nasal spray Place 2 sprays into both nostrils daily. Patient not taking: Reported on 03/06/2020 12/06/13   Tomi Bamberger,  Wille Glaser, MD  Multiple Vitamin (MULTI-VITAMIN) tablet Take 1 tablet by mouth daily.    [provider]  oxybutynin (DITROPAN-XL) 10 MG 24 hr tablet Take 1 tablet by mouth daily. 08/25/19   [provider]  oxyCODONE-acetaminophen (PERCOCET/ROXICET) 5-325 MG per tablet Take 1 tablet by mouth every 4 (four) hours as needed for moderate pain or severe pain. Patient not taking: Reported on 03/06/2020 12/06/13   Dorie Rank, MD  pseudoephedrine (SUDAFED 12 HOUR) 120 MG 12 hr tablet Take 1 tablet (120 mg total) by mouth every 12 (twelve) hours. Patient not taking: Reported on 03/06/2020 12/06/13    Dorie Rank, MD  sulfamethoxazole-trimethoprim (BACTRIM DS) 800-160 MG tablet Take 1 tablet by mouth 2 (two) times daily. 03/06/20   Sherwood Gambler, MD  triamcinolone ointment (KENALOG) 0.5 % Apply 1 application topically 2 (two) times daily. Patient not taking: Reported on 03/06/2020 11/26/15   Doristine Devoid, PA-C    Allergies    Patient has no known allergies.  Review of Systems   Review of Systems  Constitutional:  Negative for chills and fever.  Eyes:  Positive for visual disturbance. Negative for photophobia.  Respiratory:  Negative for shortness of breath.   Cardiovascular:  Negative for chest pain.  Gastrointestinal:  Positive for abdominal pain, constipation, nausea and vomiting. Negative for diarrhea.  Genitourinary:  Negative for dysuria and vaginal discharge.  Neurological:  Positive for headaches. Negative for dizziness, facial asymmetry, speech difficulty, weakness and numbness.  All other systems reviewed and are negative.  Physical Exam Updated Vital Signs BP 121/72   Pulse 72   Temp 98.9 F (37.2 C) (Oral)   Resp 18   Ht 5' 6.5" (1.689 m)   Wt 97.5 kg   LMP 08/13/2020   SpO2 100%   BMI 34.18 kg/m   Physical Exam Vitals and nursing note reviewed.  Constitutional:      General: She is not in acute distress.    Appearance: She is not ill-appearing.  HENT:     Head: Normocephalic.  Eyes:     Extraocular Movements: Extraocular movements intact.     Pupils: Pupils are equal, round, and reactive to light.     Comments: EOM intact. Pain with EOM of right eye  Cardiovascular:     Rate and Rhythm: Normal rate and regular rhythm.     Pulses: Normal pulses.     Heart sounds: Normal heart sounds. No murmur heard.   No friction rub. No gallop.  Pulmonary:     Effort: Pulmonary effort is normal.     Breath sounds: Normal breath sounds.  Abdominal:     General: Abdomen is flat. There is no distension.     Palpations: Abdomen is soft.     Tenderness: There  is abdominal tenderness. There is no guarding or rebound.     Comments: Diffuse abdominal tenderness with voluntary guarding.  No rebound.  Musculoskeletal:        General: Normal range of motion.     Cervical back: Neck supple.     Comments: Mild tenderness over right great toe.  No edema, erythema, or warmth.  Full range of motion of great toe.  Pedal pulses palpable.  Skin:    General: Skin is warm and dry.  Neurological:     General: No focal deficit present.     Mental Status: She is alert.     Comments: Speech is clear, able to follow commands CN III-XII intact Normal strength in upper and  lower extremities bilaterally including dorsiflexion and plantar flexion, strong and equal grip strength Sensation grossly intact throughout Moves extremities without ataxia, coordination intact No pronator drift Ambulates without difficulty  Psychiatric:        Mood and Affect: Mood normal.        Behavior: Behavior normal.    ED Results / Procedures / Treatments   Labs (all labs ordered are listed, but only abnormal results are displayed) Labs Reviewed  CBC WITH DIFFERENTIAL/PLATELET - Abnormal; Notable for the following components:      Result Value   RBC 3.70 (*)    Hemoglobin 8.0 (*)    HCT 27.3 (*)    MCV 73.8 (*)    MCH 21.6 (*)    MCHC 29.3 (*)    RDW 17.4 (*)    Lymphs Abs 0.4 (*)    All other components within normal limits  URINALYSIS, ROUTINE W REFLEX MICROSCOPIC - Abnormal; Notable for the following components:   Color, Urine STRAW (*)    Hgb urine dipstick LARGE (*)    All other components within normal limits  COMPREHENSIVE METABOLIC PANEL  LIPASE, BLOOD  I-STAT BETA HCG BLOOD, ED ([facility], [facility], [facility] ONLY)    EKG None  Radiology CT HEAD WO CONTRAST (5MM)  Result Date: 08/13/2020 CLINICAL DATA:  Headache, chronic, new features or increased frequency EXAM: CT HEAD WITHOUT CONTRAST TECHNIQUE: Contiguous axial images were obtained from the base of the skull through the  vertex without intravenous contrast. COMPARISON:  None. FINDINGS: Brain: No evidence of acute infarction, hemorrhage, hydrocephalus, extra-axial collection or mass lesion/mass effect. Vascular: No hyperdense vessel or unexpected calcification. Skull: Normal. Negative for fracture or focal lesion. Sinuses/Orbits: No acute finding. Other: None. IMPRESSION: No acute intracranial findings. Electronically Signed   By: Davina Poke D.O.   On: 08/13/2020 13:46   MR Brain W and Wo Contrast  Result Date: 08/13/2020 CLINICAL DATA:  Monocular vision loss EXAM: MRI HEAD AND ORBITS WITHOUT AND WITH CONTRAST TECHNIQUE: Multiplanar, multiecho pulse sequences of the brain and surrounding structures were obtained without and with intravenous contrast. Multiplanar, multiecho pulse sequences of the orbits and surrounding structures were obtained including fat saturation techniques, before and after intravenous contrast administration. CONTRAST:  54m GADAVIST GADOBUTROL 1 MMOL/ML IV SOLN COMPARISON:  None. FINDINGS: MRI HEAD FINDINGS Brain: No acute infarct, mass effect or extra-axial collection. No acute or chronic hemorrhage. Normal white matter signal, parenchymal volume and CSF spaces. The midline structures are normal. There is no abnormal contrast enhancement. Vascular: Major flow voids are preserved. Skull and upper cervical spine: Normal calvarium and skull base. Visualized upper cervical spine and soft tissues are normal. Sinuses/Orbits:No paranasal sinus fluid levels or advanced mucosal thickening. No mastoid or middle ear effusion. Normal orbits. MRI ORBITS FINDINGS Orbits: No traumatic or inflammatory finding. Globes, optic nerves, orbital fat, extraocular muscles, vascular structures, and lacrimal glands are normal. Visualized sinuses: Clear. Soft tissues: Negative. IMPRESSION: Normal MRI of the brain and orbits. Electronically Signed   By: KUlyses JarredM.D.   On: 08/13/2020 23:43   CT ABDOMEN PELVIS W  CONTRAST  Result Date: 08/13/2020 CLINICAL DATA:  Abdominal pain and constipation for 2 weeks. Crohn disease. EXAM: CT ABDOMEN AND PELVIS WITH CONTRAST TECHNIQUE: Multidetector CT imaging of the abdomen and pelvis was performed using the standard protocol following bolus administration of intravenous contrast. CONTRAST:  813mOMNIPAQUE IOHEXOL 350 MG/ML SOLN COMPARISON:  03/06/2020 and 07/04/2019 FINDINGS: Lower Chest: No acute findings. Hepatobiliary: No hepatic masses identified.  Gallbladder is unremarkable. No evidence of biliary ductal dilatation. Pancreas:  No mass or inflammatory changes. Spleen: Within normal limits in size and appearance. Adrenals/Urinary Tract: No masses identified. No evidence of ureteral calculi or hydronephrosis. Stomach/Bowel: Previously seen distal small bowel wall thickening is no longer visualized. No evidence of obstruction, inflammatory process or abnormal fluid collections. A small paraumbilical ventral hernia is now seen which contains a small bowel loop, however there is no evidence of bowel obstruction or strangulation. Vascular/Lymphatic: No pathologically enlarged lymph nodes. No acute vascular findings. Reproductive: Enlarged uterus is again seen which contains multiple fibroids, largest posteriorly measuring approximately 6 cm, without significant change compared to prior studies. Tiny amount of free fluid noted in pelvic cul-de-sac. Other:  None. Musculoskeletal:  No suspicious bone lesions identified. IMPRESSION: New small paraumbilical ventral hernia containing a small bowel loop. No evidence of bowel obstruction or strangulation. No other acute findings. Stable enlarged fibroid uterus. Electronically Signed   By: Marlaine Hind M.D.   On: 08/13/2020 17:31   DG Foot Complete Right  Result Date: 08/13/2020 CLINICAL DATA:  Fall, right great toe pain EXAM: RIGHT FOOT COMPLETE - 3+ VIEW COMPARISON:  None. FINDINGS: Acute nondisplaced longitudinally oriented fracture of the  great toe proximal phalanx with intra-articular extension to the interphalangeal joint. Fracture extends proximally to the level of the mid diaphysis of the proximal phalanx. No dislocation. No additional fractures. Soft tissues within normal limits. IMPRESSION: Acute nondisplaced intra-articular fracture of the great toe proximal phalanx. Electronically Signed   By: Davina Poke D.O.   On: 08/13/2020 13:43   MR ORBITS W WO CONTRAST  Result Date: 08/13/2020 CLINICAL DATA:  Monocular vision loss EXAM: MRI HEAD AND ORBITS WITHOUT AND WITH CONTRAST TECHNIQUE: Multiplanar, multiecho pulse sequences of the brain and surrounding structures were obtained without and with intravenous contrast. Multiplanar, multiecho pulse sequences of the orbits and surrounding structures were obtained including fat saturation techniques, before and after intravenous contrast administration. CONTRAST:  60m GADAVIST GADOBUTROL 1 MMOL/ML IV SOLN COMPARISON:  None. FINDINGS: MRI HEAD FINDINGS Brain: No acute infarct, mass effect or extra-axial collection. No acute or chronic hemorrhage. Normal white matter signal, parenchymal volume and CSF spaces. The midline structures are normal. There is no abnormal contrast enhancement. Vascular: Major flow voids are preserved. Skull and upper cervical spine: Normal calvarium and skull base. Visualized upper cervical spine and soft tissues are normal. Sinuses/Orbits:No paranasal sinus fluid levels or advanced mucosal thickening. No mastoid or middle ear effusion. Normal orbits. MRI ORBITS FINDINGS Orbits: No traumatic or inflammatory finding. Globes, optic nerves, orbital fat, extraocular muscles, vascular structures, and lacrimal glands are normal. Visualized sinuses: Clear. Soft tissues: Negative. IMPRESSION: Normal MRI of the brain and orbits. Electronically Signed   By: KUlyses JarredM.D.   On: 08/13/2020 23:43    Procedures Procedures   Medications Ordered in ED Medications   prochlorperazine (COMPAZINE) injection 10 mg (10 mg Intravenous Given 08/13/20 1542)  diphenhydrAMINE (BENADRYL) injection 12.5 mg (12.5 mg Intravenous Given 08/13/20 1542)  sodium chloride 0.9 % bolus 1,000 mL (0 mLs Intravenous Stopped 08/13/20 1615)  iohexol (OMNIPAQUE) 350 MG/ML injection 80 mL (80 mLs Intravenous Contrast Given 08/13/20 1600)  ketorolac (TORADOL) 15 MG/ML injection 15 mg (15 mg Intravenous Given 08/13/20 1926)  tetracaine (PONTOCAINE) 0.5 % ophthalmic solution 2 drop (2 drops Both Eyes Given 08/13/20 2131)  gadobutrol (GADAVIST) 1 MMOL/ML injection 10 mL (10 mLs Intravenous Contrast Given 08/13/20 2327)    ED Course  I have reviewed  the triage vital signs and the nursing notes.  Pertinent labs & imaging results that were available during my care of the patient were reviewed by me and considered in my medical decision making (see chart for details).  Clinical Course as of 08/14/20 0026  Tue Aug 13, 2020  1938 Hgb urine dipstick(!): LARGE [CA]    Clinical Course User Index [CA] Suzy Bouchard, PA-C   MDM Rules/Calculators/A&P                          40 year old female presents to the ED due to multiple complaints of abdominal pain associated with constipation, right-sided headache associated with blurry vision, and right great toe pain.  Patient has a history of Crohn's and states her abdominal pain feels similar to past flares.  She is currently on Humira twice monthly.  Upon arrival, vitals all within normal limits.  Patient is afebrile, not tachycardic or hypoxic.  Patient in no acute distress and non-ill-appearing.  Physical exam reassuring.  Normal neurological exam. Low suspicion for CVA.  Abdomen soft, nondistended with diffuse tenderness with voluntary guarding.  No rebound.  Given patient's history of prior obstructions will obtain CT abdomen to rule out obstruction versus other intra-abdominal etiologies.  Mild tenderness over right great toe.  Full range of motion.   Right lower extremity neurovascularly intact.  Labs, CT head, and right great toe x-ray ordered at triage.  IV fluids, Compazine, and Benadryl given for symptomatic relief.  UA added.  CMP unremarkable with normal renal function and no major electrolyte derangements.  Pregnancy test negative.  Lipase normal at 37.  CBC significant for anemia with hemoglobin at 8.  Hemoglobin 5 months ago was 5.8.  Patient states she has been compliant with her iron supplements.  CT head personally reviewed which is negative for any acute abnormalities.  X-ray of right foot personally reviewed which demonstrates: IMPRESSION:  Acute nondisplaced intra-articular fracture of the great toe  proximal phalanx.   UA significant for hematuria likely due to menstrual cycle.  CT abdomen personally reviewed which demonstrates new small periumbilical ventral hernia containing a small bowel loop.  No evidence of bowel obstruction or strangulation.  Stable enlarged fibroid uterus.  Hernia fully reducible.  Low suspicion for strangulation or incarceration. Advised patient to call general surgeon tomorrow to schedule an appointment for further evaluation.   Discussed case with Dr. Leonel Ramsay with neurology who recommends MRI brain/orbits due to significant difference in visual acuity of right and left eye. Order placed. IOP left 17, right (31>24>27) when checked 3 different times. Dr. Leonel Ramsay recommends consulting opthalmology if MRI results are normal.   MRI brain/orbits normal. No signs of MS or tumor. Will discuss with ophthalmology given mildly elevated IOP.   Discussed case with Dr. Sherry Ruffing who agrees with assessment and plan.  Patient handed off to [facility], PA-C at shift change pending ophthalmology recommendations.  Final Clinical Impression(s) / ED Diagnoses Final diagnoses:  Acute nonintractable headache, unspecified headache type  Generalized abdominal pain  Nondisplaced fracture of proximal phalanx of  right great toe, initial encounter for closed fracture    Rx / DC Orders ED Discharge Orders          Ordered    Ambulatory referral to Neurology       Comments: An appointment is requested in approximately: 1 week   08/14/20 0024             Suzy Bouchard,  PA-C 08/14/20 0027    Tegeler, Gwenyth Allegra, MD 08/14/20 [phone]

## 2020-08-13 NOTE — ED Notes (Signed)
Pt still in MRI 

## 2020-08-13 NOTE — Discharge Instructions (Addendum)
It was a pleasure taking care of you today.  As discussed, your CT scan showed a small hernia.  No evidence of Crohn's flare.  Please follow-up with your general surgeon this week for further evaluation of your hernia.  Your x-ray of your right big toe showed a broken bone.  Continue to use the postoperative shoe as needed for comfort.  I have included the number of the orthopedic surgeon.  Please call to schedule an appointment for further evaluation.  You may take over-the-counter ibuprofen or Tylenol as needed for pain.  Your MRI brain/eyes was normal.   Below are instructed for constipation. Medication can be purchased over the counter.  Eat a diet rich in fiber and drink plenty of water. Exercise 15-20 minutes per day if possible.  Bowel routine steps:  Step 1: Take 2 Senokot pills each day at bedtime. If no bowel movement after 2 days progress to step 2. Step 2: Take 2 Senokot pills in the morning and 2 in the evening. If no bowel movement in one day progress to step 3. Step 3: Take 3 Senokot pills and 1 capful of MiraLAX in the morning and in the evening. If no bowel movement after one day, start step 4. Step 4: Take 4 Senokot pills and one capful of MiraLAX in the morning, another capful of MiraLAX at lunch, and 4 Senokot pills and a capful of MiraLAX in the evening  *Talk to your doctor if you are still constipated after following these 4 steps. Don't take more than 8 Senokot pills a day. If loose stools develop, go back to the previous step. Do not stop the bowel routine completely.  We spoke with the ophthalmologist, Dr. Katy Fitch, who is on-call tonight, given your blurry vision, please go to his office at 8 AM for a more detailed eye exam.  His office information is in your discharge instructions.  Your hemoglobin was low. Please have it rechecked in 1 week  by PCP.   Return to the ER for new or worsening symptoms.

## 2020-08-13 NOTE — ED Provider Notes (Signed)
Emergency Medicine Provider Triage Evaluation Note  Connie Malone , a 40 y.o. female  was evaluated in triage.  Pt presents with multiple complaints.  Patient has a history of Crohn's colitis.  She states that she gets Humira injections twice per month.  She had her last injection last week.  States she has been constipated for 2 weeks.  She has been taking MiraLAX, prune juice, as well as using enemas at home with no relief.  Reports associated abdominal pain.  Patient also complains of a right-sided headache that began yesterday.  Denies a history of a headache of this severity.  States it is behind her right eye.  Reports blurry vision in the right eye.  Denies any acute numbness or weakness.  Lastly, patient states that she fell about 2-1/2 weeks ago and injured her right foot.  Reports pain and swelling to the right great toe since this occurred.  Physical Exam  BP 105/68 (BP Location: Right Arm)   Pulse 96   Temp 98.9 F (37.2 C) (Oral)   Resp 18   SpO2 98%  Gen:   Awake, no distress   Resp:  Normal effort  MSK:   Moves extremities without difficulty  Other:    Medical Decision Making  Medically screening exam initiated at 1:05 PM.  Appropriate orders placed.  Connie Malone was informed that the remainder of the evaluation will be completed by another provider, this initial triage assessment does not replace that evaluation, and the importance of remaining in the ED until their evaluation is complete.   Rayna Sexton, PA-C 08/13/20 1307    Regan Lemming, MD 08/14/20 [phone]

## 2020-08-13 NOTE — ED Notes (Signed)
Pt transported to MRI 

## 2020-08-13 NOTE — ED Notes (Signed)
Pt given sandwich and applesauce. Aware that awaiting results for dispo.

## 2020-08-13 NOTE — ED Triage Notes (Signed)
Patient c/o headache since yesterday and states she vomited x 1. Patient also c/o right eye being blurry.  Patient states she fell 2 1/2 weeks and c/o right foot pain.  Patient c/o constipation. Patient states no BM x 2 weeks.

## 2020-08-14 NOTE — ED Provider Notes (Signed)
00:10: Assumed care of patient from Connie Downs PA-C at [place] of shift pending ophthalmology consultation & likely discharge home.   Please see prior provider note for full H&P.  Briefly patient is a 40 year old female who presented to the emergency department with multiple complaints 1 of which was a right-sided headache with associated blurry vision in her right eye.  Her care was discussed with neurology who recommended MRI brain and orbits with and without contrast which was obtained and negative.  She does have decreased visual acuity in the right eye as well as mildly elevated intraocular pressures in the right eye (31, 24, 27).    02:02: CONSULT: Discussed with ophthalmologist Dr. Felipa Evener presentation, exam, and work-up, no further recommendations in the emergency department at this time, states that she can follow-up with him in clinic tomorrow morning so that he can further evaluate.  Appreciate consultation.   Ophthalmology information relayed to patient and added to her discharge instructions. Additional concerns patient presented with managed by prior provider who has placed information in discharge instructions for each of these, I have also ordered for her toes to be buddy taped with post op shoe.   I discussed results, treatment plan, need for follow-up, and return precautions with the patient. Provided opportunity for questions, patient confirmed understanding and is in agreement with plan.        Leafy Kindle 08/14/20 [id number]    Merryl Hacker, MD 08/14/20 [phone]

## 2020-09-09 ENCOUNTER — Encounter (HOSPITAL_COMMUNITY): Payer: Self-pay

## 2020-09-09 ENCOUNTER — Emergency Department (HOSPITAL_COMMUNITY)
Admission: EM | Admit: 2020-09-09 | Discharge: 2020-09-09 | Disposition: A | Discharge status: Home / Self Care | Payer: Medicaid Other | Attending: Emergency Medicine | Admitting: Emergency Medicine

## 2020-09-09 ENCOUNTER — Other Ambulatory Visit: Payer: Self-pay

## 2020-09-09 DIAGNOSIS — L02411 Cutaneous abscess of right axilla: Secondary | ICD-10-CM | POA: Insufficient documentation

## 2020-09-09 DIAGNOSIS — Z87891 Personal history of nicotine dependence: Secondary | ICD-10-CM | POA: Insufficient documentation

## 2020-09-09 MED ORDER — LIDOCAINE-EPINEPHRINE 2 %-1:100000 IJ SOLN
30.0000 mL | Freq: Once | INTRAMUSCULAR | Status: AC
  Administered 2020-09-09: 30 mL via INTRADERMAL
  Filled 2020-09-09: qty 2

## 2020-09-09 MED ORDER — FENTANYL CITRATE PF 50 MCG/ML IJ SOSY
75.0000 ug | PREFILLED_SYRINGE | Freq: Once | INTRAMUSCULAR | Status: AC
  Administered 2020-09-09: 75 ug via INTRAMUSCULAR
  Filled 2020-09-09: qty 2

## 2020-09-09 MED ORDER — TRAMADOL HCL 50 MG PO TABS: 50.0000 mg | ORAL_TABLET | Freq: Four times a day (QID) | ORAL | 0 refills | Status: DC | PRN

## 2020-09-09 MED ORDER — DOXYCYCLINE HYCLATE 100 MG PO CAPS: 100.0000 mg | ORAL_CAPSULE | Freq: Two times a day (BID) | ORAL | 0 refills | Status: AC

## 2020-09-09 MED ORDER — LIDOCAINE-EPINEPHRINE-TETRACAINE (LET) TOPICAL GEL
3.0000 mL | Freq: Once | TOPICAL | Status: AC
  Administered 2020-09-09: 3 mL via TOPICAL
  Filled 2020-09-09: qty 3

## 2020-09-09 MED ORDER — OXYCODONE-ACETAMINOPHEN 5-325 MG PO TABS
1.0000 | ORAL_TABLET | Freq: Once | ORAL | Status: AC
  Administered 2020-09-09: 1 via ORAL
  Filled 2020-09-09: qty 1

## 2020-09-09 NOTE — Discharge Instructions (Addendum)
You were given a prescription for antibiotics. Please take the antibiotic prescription fully.   Prescription given for Tramadol. Take medication as directed and do not operate machinery, drive a car, or work while taking this medication as it can make you drowsy.   Return to the ed for packing removal in 2 days   Please follow up with your primary care provider within 5-7 days for re-evaluation of your symptoms. If you do not have a primary care provider, information for a healthcare clinic has been provided for you to make arrangements for follow up care.  Please return to the emergency room immediately if you experience any new or worsening symptoms or any symptoms that indicate worsening infection such as fevers, increased redness/swelling/pain, warmth, or drainage from the affected area.

## 2020-09-09 NOTE — ED Provider Notes (Signed)
[facility] DEPT Provider Note   CSN: [id number] Arrival date & time: 09/09/20  1103     History Chief Complaint  Patient presents with   Abscess    Right axillary    Connie Malone is a 40 y.o. female.  HPI   40 y/o F with a h/o anemia, bladder mass, crohn's disease, intestinal obstruction, iron deficiency anemia, microcytic anemia, presenting for eval of abscess that started a few days ago. Rash is swollen and painful to touch. She does not believe she has had fevers  Past Medical History:  Diagnosis Date   Anemia    Bladder mass    Blood transfusion without reported diagnosis    Crohn disease ([place])    Intestinal obstruction ([place])    Iron deficiency anemia    Microcytic anemia     There are no problems to display for this patient.   Past Surgical History:  Procedure Laterality Date   CYSTOSCOPY       OB History   No obstetric history on file.     Family History  Family history unknown: Yes    Social History   Tobacco Use   Smoking status: Former   Smokeless tobacco: Never  Scientific laboratory technician Use: Never used  Substance Use Topics   Alcohol use: Yes    Comment: occasional   Drug use: No    Home Medications Prior to Admission medications   Medication Sig Start Date End Date Taking? Authorizing Provider  doxycycline (VIBRAMYCIN) 100 MG capsule Take 1 capsule (100 mg total) by mouth 2 (two) times daily for 7 days. 09/09/20 09/16/20 Yes Syona Wroblewski S, PA-C  acetaminophen (TYLENOL) 325 MG tablet Take 650 mg by mouth every 6 (six) hours as needed for mild pain, fever or headache.    [provider]  Adalimumab 40 MG/0.4ML PNKT Inject 0.4 mLs into the skin every 14 (fourteen) days. 07/27/19   [provider]  clindamycin (CLEOCIN) 150 MG capsule Take 1 capsule (150 mg total) by mouth every 6 (six) hours. Patient not taking: No sig reported 09/03/13   Ernestina Patches, MD  Fe Fum-Fe Poly-Vit C-Lactobac  (FUSION PO) Take 1 capsule by mouth every morning.    [provider]  ferrous sulfate 325 (65 FE) MG tablet Take 1 tablet by mouth daily. 06/17/17   [provider]  fluticasone (FLONASE) 50 MCG/ACT nasal spray Place 2 sprays into both nostrils daily. Patient not taking: Reported on 03/06/2020 12/06/13   Dorie Rank, MD  Multiple Vitamin (MULTI-VITAMIN) tablet Take 1 tablet by mouth daily.    [provider]  oxybutynin (DITROPAN-XL) 10 MG 24 hr tablet Take 1 tablet by mouth daily. 08/25/19   [provider]  oxyCODONE-acetaminophen (PERCOCET/ROXICET) 5-325 MG per tablet Take 1 tablet by mouth every 4 (four) hours as needed for moderate pain or severe pain. Patient not taking: Reported on 03/06/2020 12/06/13   Dorie Rank, MD  pseudoephedrine (SUDAFED 12 HOUR) 120 MG 12 hr tablet Take 1 tablet (120 mg total) by mouth every 12 (twelve) hours. Patient not taking: Reported on 03/06/2020 12/06/13   Dorie Rank, MD  sulfamethoxazole-trimethoprim (BACTRIM DS) 800-160 MG tablet Take 1 tablet by mouth 2 (two) times daily. 03/06/20   Sherwood Gambler, MD  triamcinolone ointment (KENALOG) 0.5 % Apply 1 application topically 2 (two) times daily. Patient not taking: Reported on 03/06/2020 11/26/15   Doristine Devoid, PA-C    Allergies    Patient has  no known allergies.  Review of Systems   Review of Systems  Constitutional:  Positive for fever.  Skin:        abscess   Physical Exam Updated Vital Signs BP 121/79 (BP Location: Left Arm)   Pulse 60   Temp 99 F (37.2 C) (Oral)   Resp 17   Ht 5' 6.5" (1.689 m)   Wt 99.3 kg   LMP 08/13/2020   SpO2 100%   BMI 34.82 kg/m   Physical Exam Vitals and nursing note reviewed.  Constitutional:      General: She is not in acute distress.    Appearance: She is well-developed.  HENT:     Head: Normocephalic and atraumatic.  Eyes:     Conjunctiva/sclera: Conjunctivae normal.  Cardiovascular:     Rate and Rhythm: Normal  rate.  Pulmonary:     Effort: Pulmonary effort is normal.  Musculoskeletal:        General: Normal range of motion.     Cervical back: Neck supple.  Skin:    General: Skin is warm and dry.     Comments: 3x2 cm area of fluctuance and erythema to the right axilla that is TTP. No significant surrounding erythema. No crepitus.   Neurological:     Mental Status: She is alert.    ED Results / Procedures / Treatments   Labs (all labs ordered are listed, but only abnormal results are displayed) Labs Reviewed - No data to display  EKG None  Radiology No results found.  Procedures Procedures   Medications Ordered in ED Medications  lidocaine-EPINEPHrine-tetracaine (LET) topical gel (3 mLs Topical Given 09/09/20 1406)  lidocaine-EPINEPHrine (XYLOCAINE W/EPI) 2 %-1:100000 (with pres) injection 30 mL (30 mLs Intradermal Given 09/09/20 1406)  oxyCODONE-acetaminophen (PERCOCET/ROXICET) 5-325 MG per tablet 1 tablet (1 tablet Oral Given 09/09/20 1405)  fentaNYL (SUBLIMAZE) injection 75 mcg (75 mcg Intramuscular Given 09/09/20 1535)    ED Course  I have reviewed the triage vital signs and the nursing notes.  Pertinent labs & imaging results that were available during my care of the patient were reviewed by me and considered in my medical decision making (see chart for details).    MDM Rules/Calculators/A&P                           Patient with skin abscess amenable to incision and drainage.  Wound was packed.  wound recheck in 2 days. Encouraged home warm soaks and flushing.  Mild signs of cellulitis is surrounding skin.  Will d/c to home with doxy     Final Clinical Impression(s) / ED Diagnoses Final diagnoses:  Abscess of axilla, right    Rx / DC Orders ED Discharge Orders          Ordered    doxycycline (VIBRAMYCIN) 100 MG capsule  2 times daily        09/09/20 [address], [place], PA-C 09/09/20 1626    Lacretia Leigh, MD 09/10/20 1450

## 2020-09-09 NOTE — ED Triage Notes (Signed)
Pt presents to the ED via POV for a right axillary abscess which she states began three days ago. She reports a prior hx of abscess to this area however she states "I don;t know" when asked the last time she has had an episode similar to present. Pt states she has used warm compresses and heating pads alone for her current sx without relief. Pt reports a known hx of Crohn's Disease.

## 2020-11-11 ENCOUNTER — Ambulatory Visit: Payer: No Typology Code available for payment source | Admitting: Diagnostic Neuroimaging

## 2020-11-11 ENCOUNTER — Encounter: Payer: Self-pay | Admitting: Diagnostic Neuroimaging

## 2020-11-20 ENCOUNTER — Emergency Department (HOSPITAL_COMMUNITY): Payer: Self-pay

## 2020-11-20 ENCOUNTER — Emergency Department (HOSPITAL_COMMUNITY)
Admission: EM | Admit: 2020-11-20 | Discharge: 2020-11-20 | Disposition: A | Discharge status: Home / Self Care | Payer: Self-pay | Attending: Emergency Medicine | Admitting: Emergency Medicine

## 2020-11-20 ENCOUNTER — Encounter (HOSPITAL_COMMUNITY): Payer: Self-pay | Admitting: Emergency Medicine

## 2020-11-20 DIAGNOSIS — Z87891 Personal history of nicotine dependence: Secondary | ICD-10-CM | POA: Insufficient documentation

## 2020-11-20 DIAGNOSIS — M62838 Other muscle spasm: Secondary | ICD-10-CM | POA: Insufficient documentation

## 2020-11-20 DIAGNOSIS — R202 Paresthesia of skin: Secondary | ICD-10-CM | POA: Insufficient documentation

## 2020-11-20 LAB — COMPREHENSIVE METABOLIC PANEL
ALT: 20 U/L (ref 0–44)
AST: 25 U/L (ref 15–41)
Albumin: 3.8 g/dL (ref 3.5–5.0)
Alkaline Phosphatase: 66 U/L (ref 38–126)
Anion gap: 5 (ref 5–15)
BUN: 9 mg/dL (ref 6–20)
CO2: 24 mmol/L (ref 22–32)
Calcium: 8.7 mg/dL — ABNORMAL LOW (ref 8.9–10.3)
Chloride: 106 mmol/L (ref 98–111)
Creatinine, Ser: 0.66 mg/dL (ref 0.44–1.00)
GFR, Estimated: >60 mL/min (ref >60)
Glucose, Bld: 94 mg/dL (ref 70–99)
Potassium: 4 mmol/L (ref 3.5–5.1)
Sodium: 135 mmol/L (ref 135–145)
Total Bilirubin: 0.3 mg/dL (ref 0.3–1.2)
Total Protein: 8.1 g/dL (ref 6.5–8.1)

## 2020-11-20 LAB — CBC WITH DIFFERENTIAL/PLATELET
Abs Immature Granulocytes: 0.01 10*3/uL (ref 0.00–0.07)
Basophils Absolute: 0 10*3/uL (ref 0.0–0.1)
Basophils Relative: 0 %
Eosinophils Absolute: 0.1 10*3/uL (ref 0.0–0.5)
Eosinophils Relative: 6 %
HCT: 34.7 % — ABNORMAL LOW (ref 36.0–46.0)
Hemoglobin: 10.2 g/dL — ABNORMAL LOW (ref 12.0–15.0)
Immature Granulocytes: 0 %
Lymphocytes Relative: 14 %
Lymphs Abs: 0.3 10*3/uL — ABNORMAL LOW (ref 0.7–4.0)
MCH: 22 pg — ABNORMAL LOW (ref 26.0–34.0)
MCHC: 29.4 g/dL — ABNORMAL LOW (ref 30.0–36.0)
MCV: 74.8 fL — ABNORMAL LOW (ref 80.0–100.0)
Monocytes Absolute: 0.2 10*3/uL (ref 0.1–1.0)
Monocytes Relative: 8 %
Neutro Abs: 1.7 10*3/uL (ref 1.7–7.7)
Neutrophils Relative %: 72 %
Platelets: 345 10*3/uL (ref 150–400)
RBC: 4.64 MIL/uL (ref 3.87–5.11)
RDW: 27.1 % — ABNORMAL HIGH (ref 11.5–15.5)
WBC: 2.4 10*3/uL — ABNORMAL LOW (ref 4.0–10.5)
nRBC: 0 % (ref 0.0–0.2)

## 2020-11-20 LAB — I-STAT BETA HCG BLOOD, ED (MC, WL, AP ONLY): I-stat hCG, quantitative: <5 m[IU]/mL (ref <5)

## 2020-11-20 IMAGING — CT CT HEAD W/O CM
3 series · 14 of 47 positions shown, 16 images · non-contrast
Comparison: Brain MRI [DATE]

CLINICAL DATA: Motor neuron disease.

EXAM:
CT HEAD WITHOUT CONTRAST
TECHNIQUE: Contiguous axial images were obtained from the base of the skull
through the vertex without intravenous contrast.

[Series 2: head wo · axial · 0.47mm/px · z∈[+1307,+1432]mm · 8 of 31 slices shown, 10 images]
[im 3/31  brain]
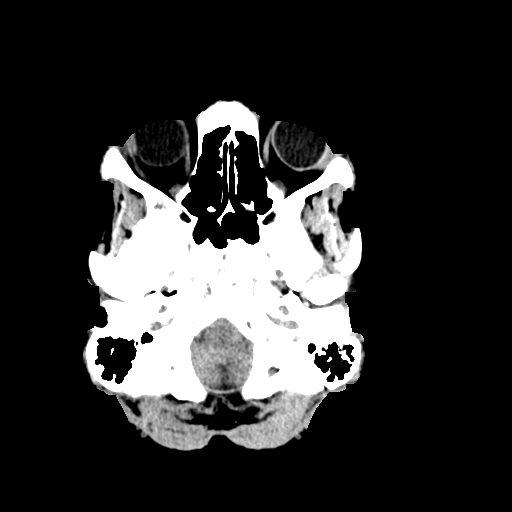
[im 3/31  bone]
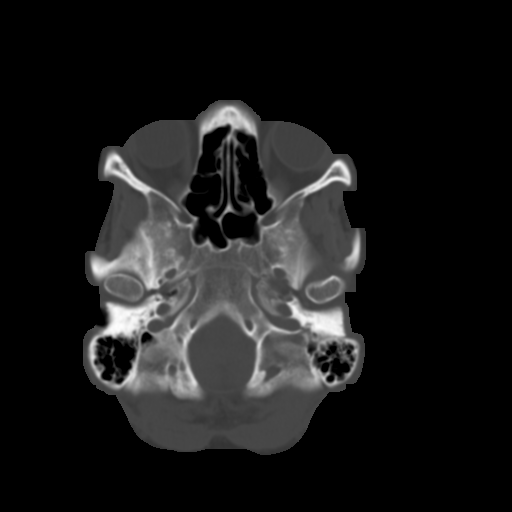
[im 7/31  brain]
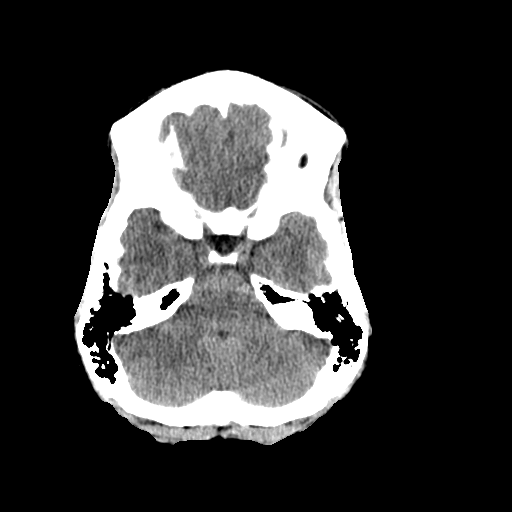
[im 10/31  brain]
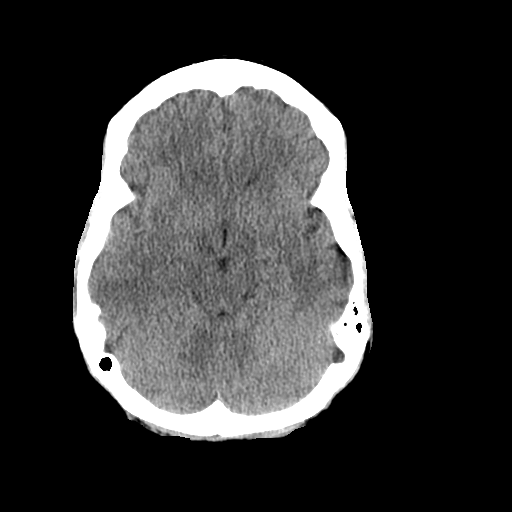
[im 14/31  brain]
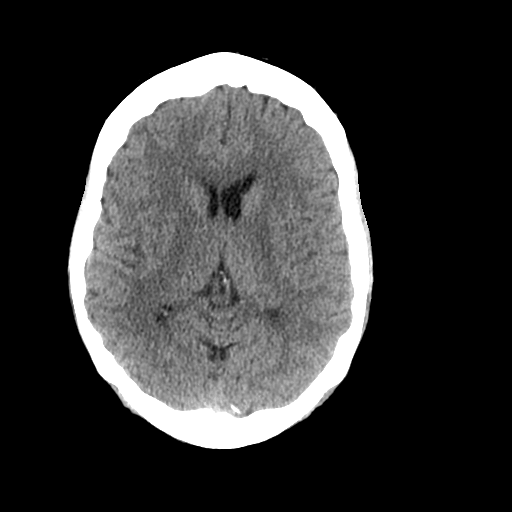
[im 17/31  brain]
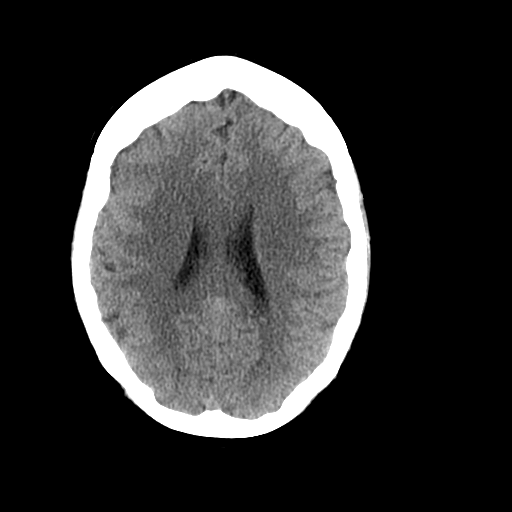
[im 17/31  bone]
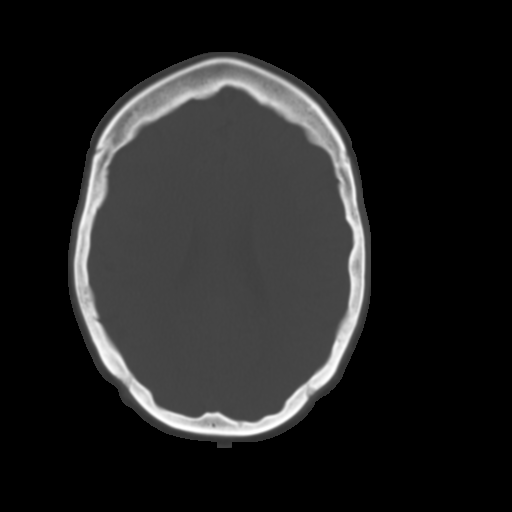
[im 21/31  brain]
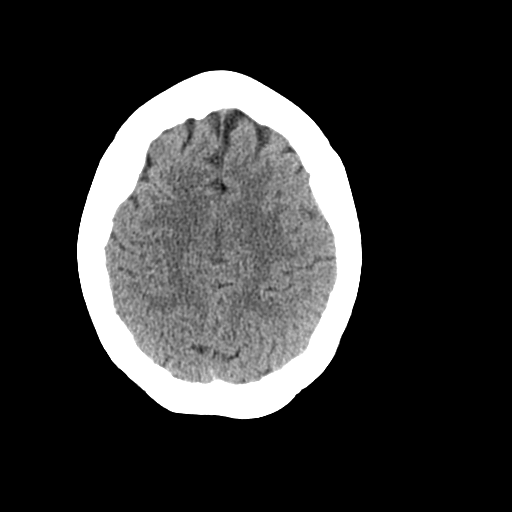
[im 24/31  brain]
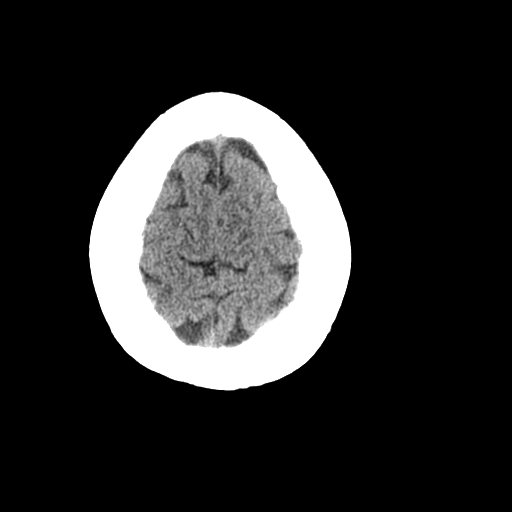
[im 28/31  brain]
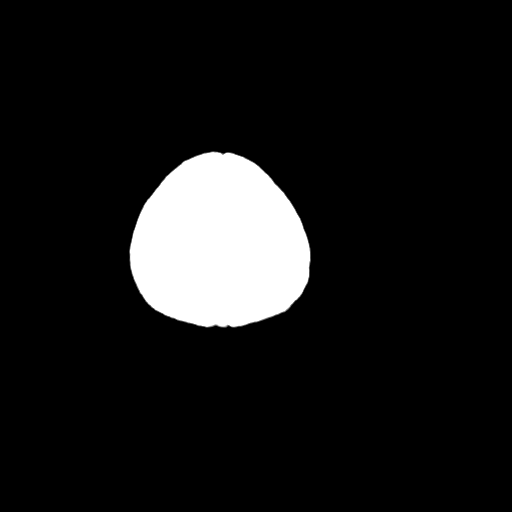

[Series 5: coronal soft tissue · coronal · 0.37mm/px · 3 of 65 slices shown]
[im 22/65  brain]
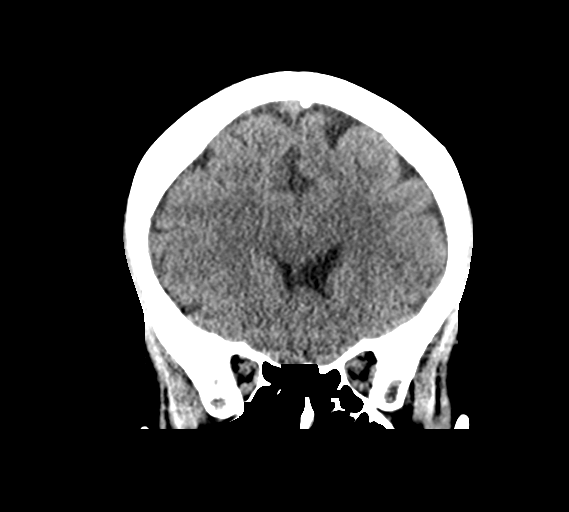
[im 29/65  brain]
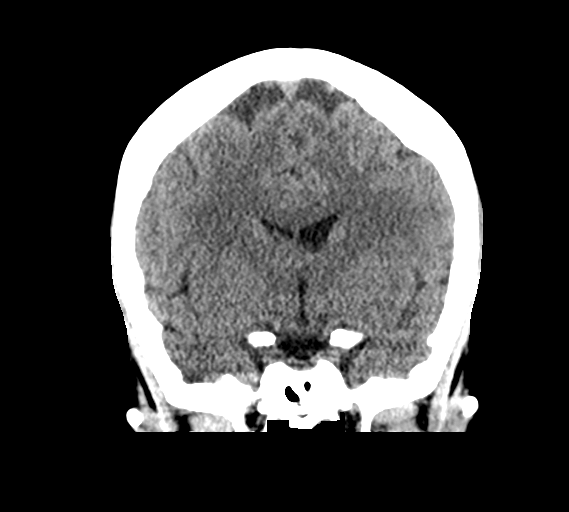
[im 36/65  brain]
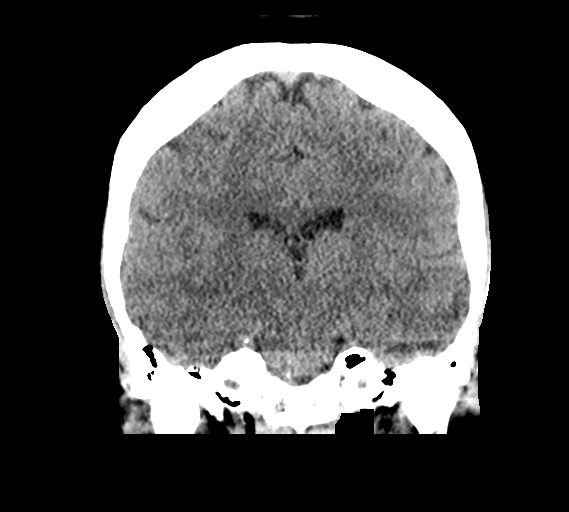

[Series 6: sagittal soft tissue · sagittal · 0.33mm/px · 3 of 49 slices shown]
[im 17/49  brain]
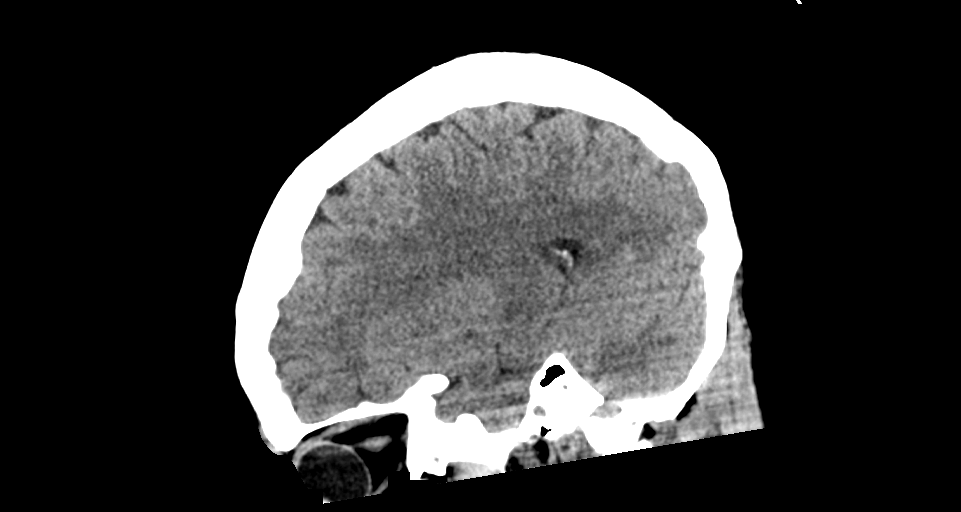
[im 25/49  brain]
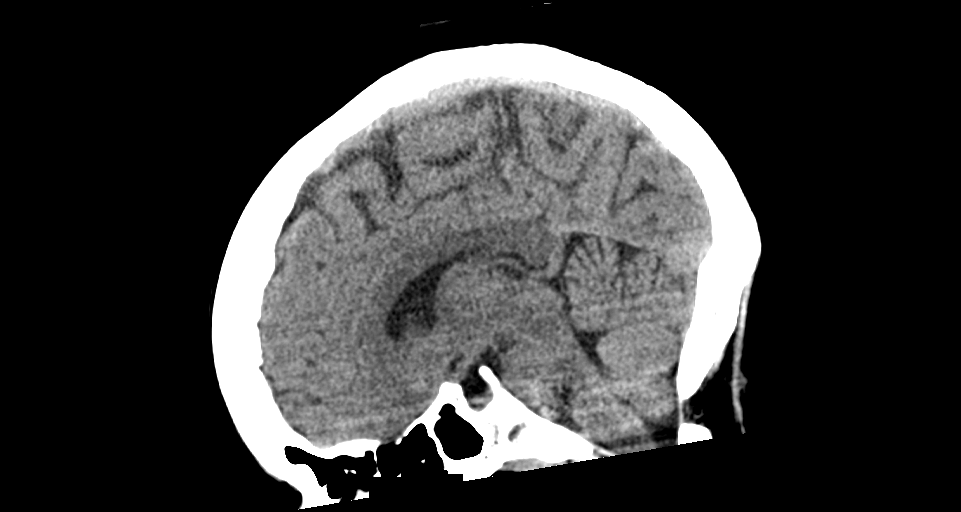
[im 33/49  brain]
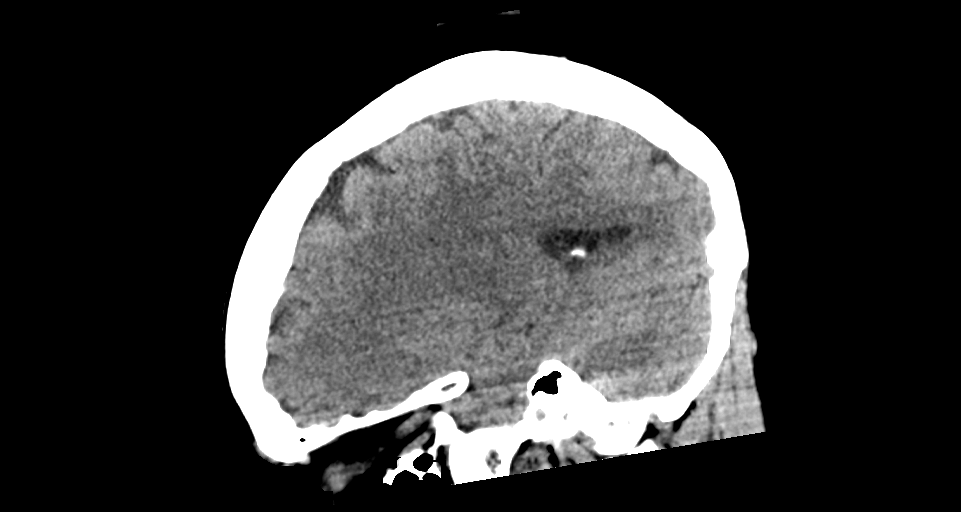

[14 of 47 positions shown; findings below may reference images not displayed]

FINDINGS: Brain: No evidence of acute infarction, hemorrhage, hydrocephalus,
extra-axial collection or mass lesion/mass effect.

Vascular: No hyperdense vessel or unexpected calcification.

Skull: Normal. Negative for fracture or focal lesion.

Sinuses/Orbits: No acute finding.

Other: None.
IMPRESSION: No acute intracranial abnormalities. Normal brain.

## 2020-11-20 IMAGING — CR DG LUMBAR SPINE COMPLETE 4+V
5 series · 5 of 5 positions shown · non-contrast
Comparison: CT [DATE]

CLINICAL DATA: Weakness, bilateral leg weakness and tingling in
both feet, no injury

EXAM:
LUMBAR SPINE - COMPLETE 4+ VIEW

[t lumbar spine ap]
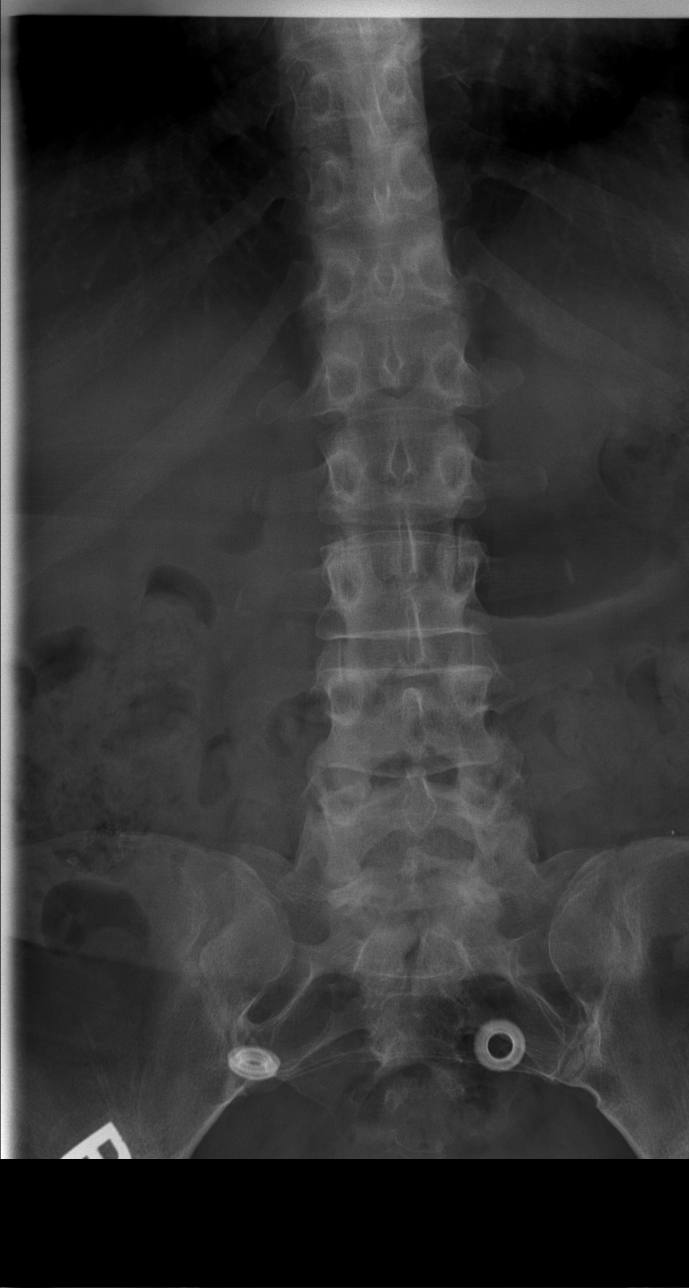

[t lumbar spine obl (1 of 2)]
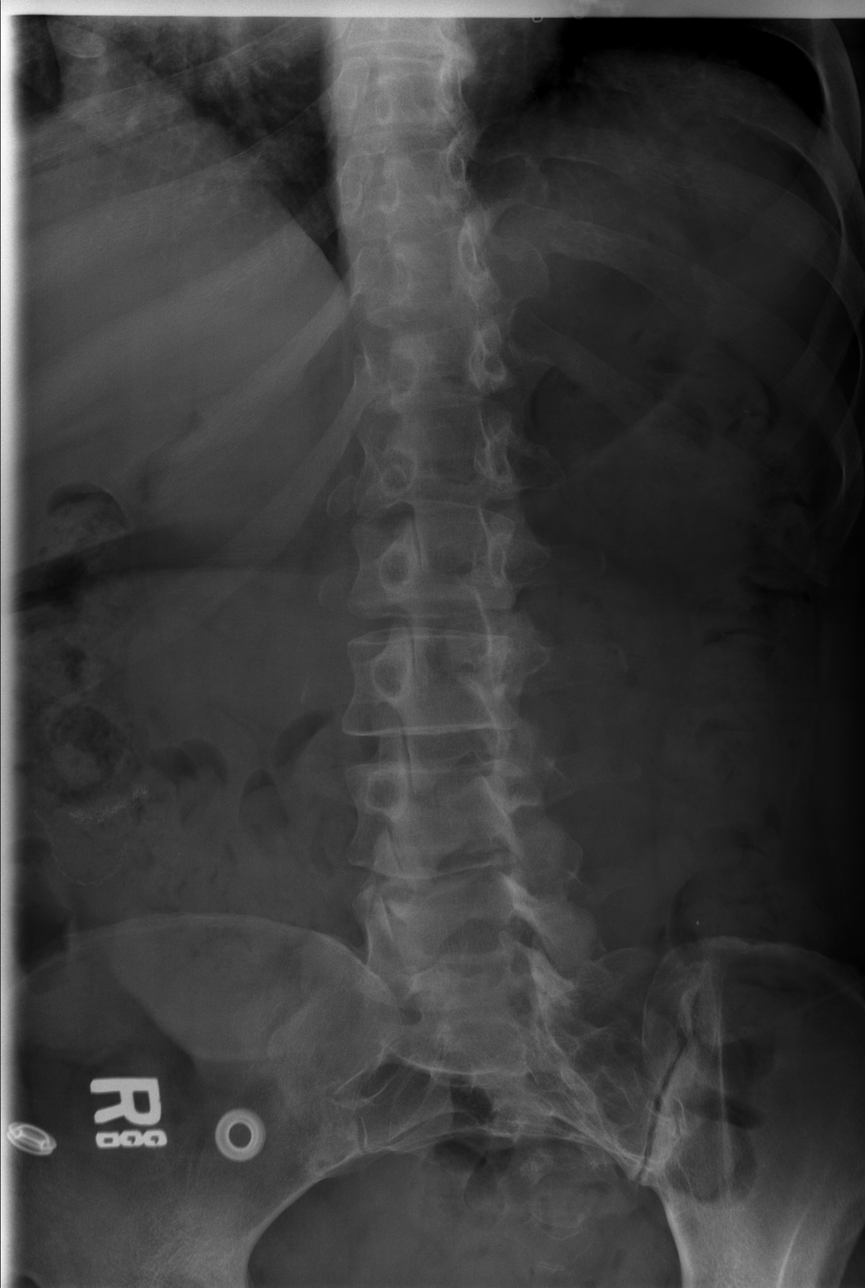

[t lumbar spine obl (2 of 2)]
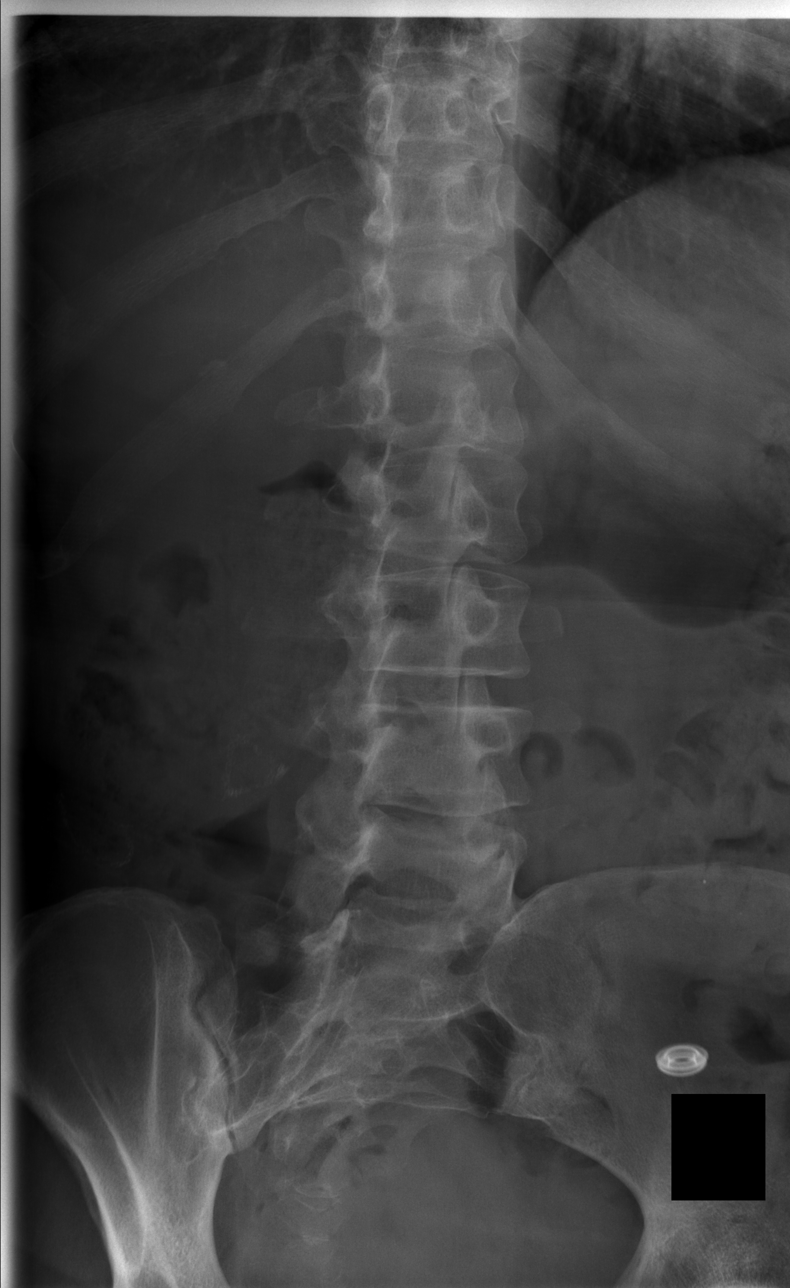

[t lumbar spine lat]
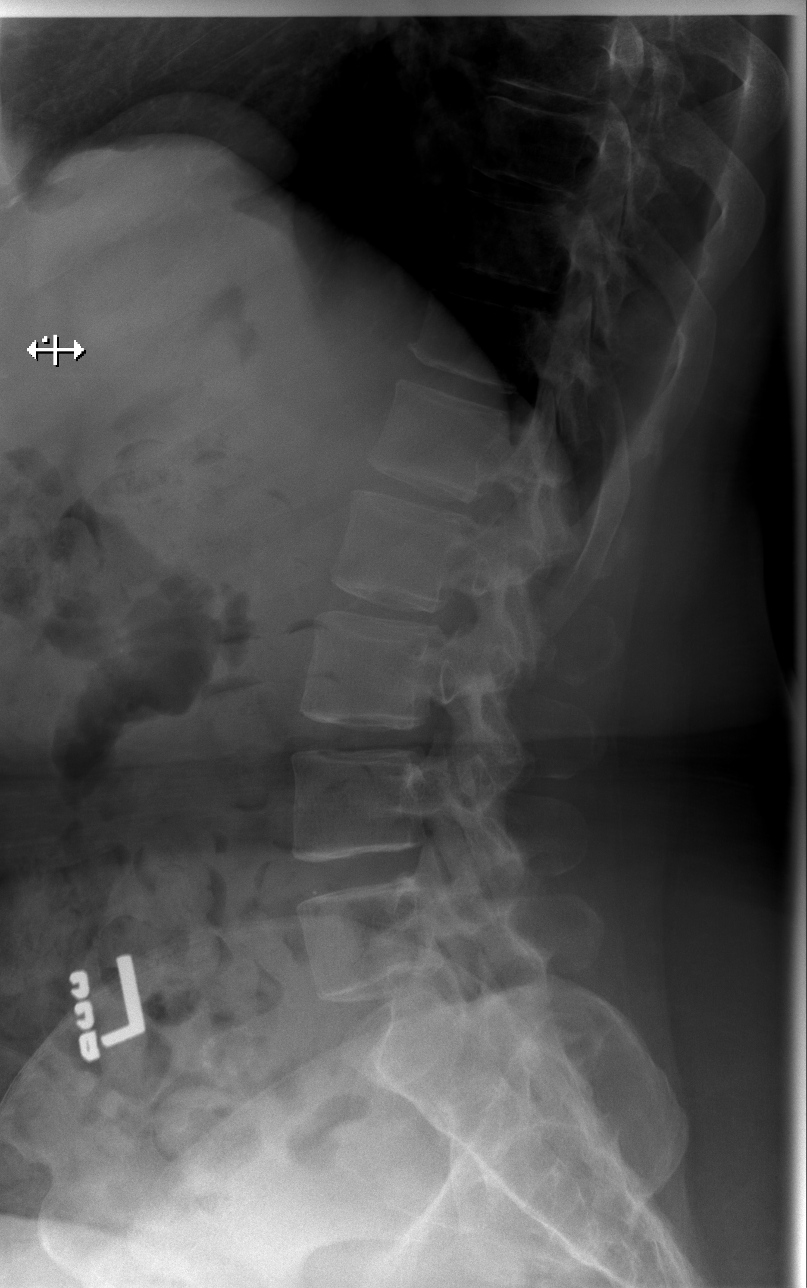

[t lumbar l-5 s-1 spot]
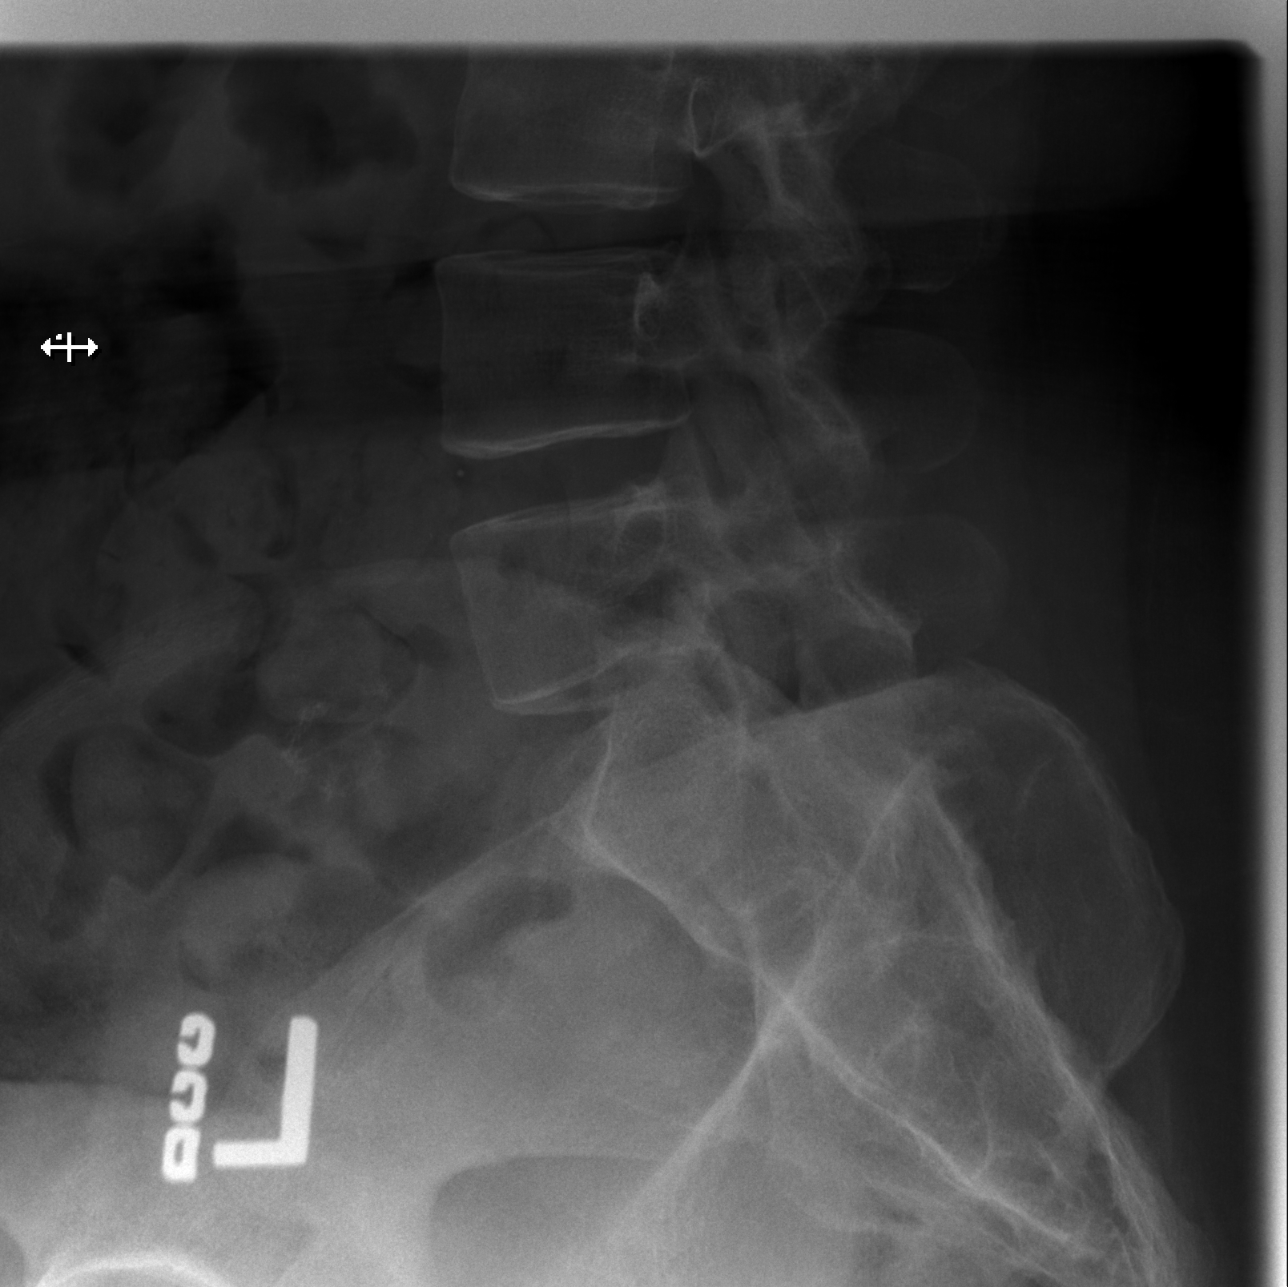

[5 of 5 positions shown; findings below may reference images not displayed]

FINDINGS: There are 5 non-rib-bearing lumbar vertebrae. There is mild disc
height loss at L3-L4. No significant facet arthropathy. No evidence
of lumbar spine fracture. Normal alignment.
IMPRESSION: Mild disc height loss at L3-L4. No evidence of lumbar spine
fracture.

## 2020-11-20 MED ORDER — METHOCARBAMOL 500 MG PO TABS: 1000.0000 mg | ORAL_TABLET | Freq: Two times a day (BID) | ORAL | 0 refills | Status: AC

## 2020-11-20 MED ORDER — METHOCARBAMOL 500 MG PO TABS
1000.0000 mg | ORAL_TABLET | Freq: Once | ORAL | Status: AC
  Administered 2020-11-20: 1000 mg via ORAL
  Filled 2020-11-20: qty 2

## 2020-11-20 NOTE — ED Notes (Signed)
Pt A&OX4 ambulatory at d/c with independent steady gait, NAD. Pt declined wheelchair. Pt ambulatory at d/c slow, independent steady gait. Pt verbalized understanding of d/c instructions and follow up care.

## 2020-11-20 NOTE — ED Provider Notes (Signed)
[facility] DEPT Provider Note   CSN: [id number] Arrival date & time: 11/20/20  1032     History Chief Complaint  Patient presents with   Numbness    tingling    Connie Malone is a 40 y.o. female.  This is a 40 y.o. female with significant medical history as below, including crohn disease who presents to the ED with complaint of tingling sensation to hands and feet. Intermittent muscle cramping to her calves. Patient has been experiencing this complaint for multiple weeks.  Feels the symptoms are grossly unchanged.  They are intermittent.  Currently asymptomatic.  She has no numbness in her groin, no significant back pain.  Is ambulatory with a steady gait.  No fevers or IV drug use.  No headaches, or neck discomfort.  No alleviating or exacerbating factors identified.  The history is provided by the patient. No language interpreter was used.      Past Medical History:  Diagnosis Date   Anemia    Bladder mass    Blood transfusion without reported diagnosis    Crohn disease ([place])    Intestinal obstruction ([facility])    Iron deficiency anemia    Microcytic anemia     There are no problems to display for this patient.   Past Surgical History:  Procedure Laterality Date   CYSTOSCOPY       OB History   No obstetric history on file.     Family History  Family history unknown: Yes    Social History   Tobacco Use   Smoking status: Former   Smokeless tobacco: Never  Scientific laboratory technician Use: Never used  Substance Use Topics   Alcohol use: Yes    Comment: occasional   Drug use: No    Home Medications Prior to Admission medications   Medication Sig Start Date End Date Taking? Authorizing Provider  methocarbamol (ROBAXIN) 500 MG tablet Take 2 tablets (1,000 mg total) by mouth 2 (two) times daily. 11/20/20  Yes Jeanell Sparrow, DO  acetaminophen (TYLENOL) 325 MG tablet Take 650 mg by mouth every 6 (six) hours as needed for mild pain,  fever or headache.    [provider]  Adalimumab 40 MG/0.4ML PNKT Inject 0.4 mLs into the skin every 14 (fourteen) days. 07/27/19   [provider]  clindamycin (CLEOCIN) 150 MG capsule Take 1 capsule (150 mg total) by mouth every 6 (six) hours. Patient not taking: No sig reported 09/03/13   Ernestina Patches, MD  Fe Fum-Fe Poly-Vit C-Lactobac (FUSION PO) Take 1 capsule by mouth every morning.    [provider]  ferrous sulfate 325 (65 FE) MG tablet Take 1 tablet by mouth daily. 06/17/17   [provider]  fluticasone (FLONASE) 50 MCG/ACT nasal spray Place 2 sprays into both nostrils daily. Patient not taking: Reported on 03/06/2020 12/06/13   Dorie Rank, MD  Multiple Vitamin (MULTI-VITAMIN) tablet Take 1 tablet by mouth daily.    [provider]  oxybutynin (DITROPAN-XL) 10 MG 24 hr tablet Take 1 tablet by mouth daily. 08/25/19   [provider]  oxyCODONE-acetaminophen (PERCOCET/ROXICET) 5-325 MG per tablet Take 1 tablet by mouth every 4 (four) hours as needed for moderate pain or severe pain. Patient not taking: Reported on 03/06/2020 12/06/13   Dorie Rank, MD  pseudoephedrine (SUDAFED 12 HOUR) 120 MG 12 hr tablet Take 1 tablet (120 mg total) by mouth every 12 (twelve) hours. Patient not taking: Reported on 03/06/2020 12/06/13  Dorie Rank, MD  sulfamethoxazole-trimethoprim (BACTRIM DS) 800-160 MG tablet Take 1 tablet by mouth 2 (two) times daily. 03/06/20   Sherwood Gambler, MD  traMADol (ULTRAM) 50 MG tablet Take 1 tablet (50 mg total) by mouth every 6 (six) hours as needed. 09/09/20   Couture, Cortni S, PA-C  triamcinolone ointment (KENALOG) 0.5 % Apply 1 application topically 2 (two) times daily. Patient not taking: Reported on 03/06/2020 11/26/15   Doristine Devoid, PA-C    Allergies    Patient has no known allergies.  Review of Systems   Review of Systems  Constitutional:  Negative for chills and fever.  HENT:  Negative for facial  swelling and trouble swallowing.   Eyes:  Negative for photophobia and visual disturbance.  Respiratory:  Negative for cough and shortness of breath.   Cardiovascular:  Negative for chest pain and palpitations.  Gastrointestinal:  Negative for abdominal pain, nausea and vomiting.  Endocrine: Negative for polydipsia and polyuria.  Genitourinary:  Negative for difficulty urinating and hematuria.  Musculoskeletal:  Negative for gait problem and joint swelling.  Skin:  Negative for pallor and rash.  Neurological:  Negative for syncope and headaches.       Paresthesias  Psychiatric/Behavioral:  Negative for agitation and confusion.    Physical Exam Updated Vital Signs BP (!) 97/51   Pulse 85   Temp 98 F (36.7 C) (Oral)   Resp 18   LMP 11/06/2020   SpO2 100%   Physical Exam Vitals and nursing note reviewed.  Constitutional:      General: She is not in acute distress.    Appearance: Normal appearance.  HENT:     Head: Normocephalic and atraumatic.     Right Ear: External ear normal.     Left Ear: External ear normal.     Nose: Nose normal.     Mouth/Throat:     Mouth: Mucous membranes are moist.  Eyes:     General: No scleral icterus.       Right eye: No discharge.        Left eye: No discharge.     Extraocular Movements: Extraocular movements intact.     Pupils: Pupils are equal, round, and reactive to light.  Cardiovascular:     Rate and Rhythm: Normal rate and regular rhythm.     Pulses: Normal pulses.     Heart sounds: Normal heart sounds.  Pulmonary:     Effort: Pulmonary effort is normal. No respiratory distress.     Breath sounds: Normal breath sounds.  Abdominal:     General: Abdomen is flat.     Tenderness: There is no abdominal tenderness.  Musculoskeletal:        General: Normal range of motion.     Cervical back: Full passive range of motion without pain and normal range of motion.     Right lower leg: No edema.     Left lower leg: No edema.  Skin:     General: Skin is warm and dry.     Capillary Refill: Capillary refill takes less than 2 seconds.  Neurological:     Mental Status: She is alert and oriented to person, place, and time.     GCS: GCS eye subscore is 4. GCS verbal subscore is 5. GCS motor subscore is 6.     Cranial Nerves: Cranial nerves 2-12 are intact. No dysarthria or facial asymmetry.     Sensory: Sensation is intact.     Motor: Motor function is  intact. No tremor.     Coordination: Coordination is intact. Finger-Nose-Finger Test normal.     Gait: Gait is intact.  Psychiatric:        Mood and Affect: Mood normal.        Behavior: Behavior normal.    ED Results / Procedures / Treatments   Labs (all labs ordered are listed, but only abnormal results are displayed) Labs Reviewed  CBC WITH DIFFERENTIAL/PLATELET - Abnormal; Notable for the following components:      Result Value   WBC 2.4 (*)    Hemoglobin 10.2 (*)    HCT 34.7 (*)    MCV 74.8 (*)    MCH 22.0 (*)    MCHC 29.4 (*)    RDW 27.1 (*)    Lymphs Abs 0.3 (*)    All other components within normal limits  COMPREHENSIVE METABOLIC PANEL - Abnormal; Notable for the following components:   Calcium 8.7 (*)    All other components within normal limits  I-STAT BETA HCG BLOOD, ED ([facility], [facility], [facility] ONLY)    EKG None  Radiology DG Lumbar Spine Complete  Result Date: 11/20/2020 CLINICAL DATA:  Weakness, bilateral leg weakness and tingling in both feet, no injury EXAM: LUMBAR SPINE - COMPLETE 4+ VIEW COMPARISON:  CT 08/13/2020 FINDINGS: There are 5 non-rib-bearing lumbar vertebrae. There is mild disc height loss at L3-L4. No significant facet arthropathy. No evidence of lumbar spine fracture. Normal alignment. IMPRESSION: Mild disc height loss at L3-L4. No evidence of lumbar spine fracture. Electronically Signed   By: Maurine Simmering M.D.   On: 11/20/2020 12:15   CT HEAD WO CONTRAST (5MM)  Result Date: 11/20/2020 CLINICAL DATA:  Motor neuron disease. EXAM: CT HEAD WITHOUT  CONTRAST TECHNIQUE: Contiguous axial images were obtained from the base of the skull through the vertex without intravenous contrast. COMPARISON:  Brain MRI 08/13/2020 FINDINGS: Brain: No evidence of acute infarction, hemorrhage, hydrocephalus, extra-axial collection or mass lesion/mass effect. Vascular: No hyperdense vessel or unexpected calcification. Skull: Normal. Negative for fracture or focal lesion. Sinuses/Orbits: No acute finding. Other: None. IMPRESSION: No acute intracranial abnormalities. Normal brain. Electronically Signed   By: Kerby Moors M.D.   On: 11/20/2020 12:32    Procedures Procedures   Medications Ordered in ED Medications  methocarbamol (ROBAXIN) tablet 1,000 mg (1,000 mg Oral Given 11/20/20 1622)    ED Course  I have reviewed the triage vital signs and the nursing notes.  Pertinent labs & imaging results that were available during my care of the patient were reviewed by me and considered in my medical decision making (see chart for details).    MDM Rules/Calculators/A&P                           CC: paresthesia  This patient complains of above; this involves an extensive number of treatment options and is a complaint that carries with it a high risk of complications and morbidity. Vital signs were reviewed. Serious etiologies considered.  Record review:  Previous records obtained and reviewed   Work up as above, notable for:  Labs & imaging results that were available during my care of the patient were reviewed by me and considered in my medical decision making.   I ordered imaging studies which included CTH, xr lumbar and I independently visualized and interpreted imaging which showed no acute process, mild disc height loss at l3-l4.   Labs stable  Management: Pt given robaxin  Reassessment:  She reports that she feels grossly normal at this time, she has been able to ambulate in the ED without difficulty. Her neuro exam remains non-focal. She is able  to tolerate oral intake without difficulty. She overall is well appearing. Negative imaging and labs in the ED. Symptoms do appear consistent with neuropathy. Recommend she f/u with neurology.   The patient improved significantly and was discharged in stable condition. Detailed discussions were had with the patient regarding current findings, and need for close f/u with PCP or on call doctor. The patient has been instructed to return immediately if the symptoms worsen in any way for re-evaluation. Patient verbalized understanding and is in agreement with current care plan. All questions answered prior to discharge.           This chart was dictated using voice recognition software.  Despite best efforts to proofread,  errors can occur which can change the documentation meaning.  Final Clinical Impression(s) / ED Diagnoses Final diagnoses:  Paresthesia  Muscle spasm    Rx / DC Orders ED Discharge Orders          Ordered    Ambulatory referral to Neurology       Comments: An appointment is requested in approximately: 1 week   11/20/20 1617    methocarbamol (ROBAXIN) 500 MG tablet  2 times daily        11/20/20 1618             Jeanell Sparrow, DO 11/22/20 0112

## 2020-11-20 NOTE — ED Provider Notes (Signed)
Emergency Medicine Provider Triage Evaluation Note  Connie Malone , a 40 y.o. female  was evaluated in triage.  Pt complains of lower extremity weakness.  Feels like her legs give out on her.  States her bilateral legs are numb and tingly.  Has had multiple falls.  Does not think she hit her head.  No bowel or bladder incontinence, fever, IV drug use  Review of Systems  Positive: Bilateral lower extremity weakness Negative: Fever, IVDU  Physical Exam  BP 131/88 (BP Location: Left Arm)   Pulse 100   Temp 98 F (36.7 C) (Oral)   Resp 18   LMP 11/06/2020   SpO2 100%  Gen:   Awake, no distress   Resp:  Normal effort  MSK:   Difficulty with range of motion bilateral lower extremities Other:    Medical Decision Making  Medically screening exam initiated at 11:21 AM.  Appropriate orders placed.  Connie Malone was informed that the remainder of the evaluation will be completed by another provider, this initial triage assessment does not replace that evaluation, and the importance of remaining in the ED until their evaluation is complete.  Bilateral lower extremity weakness   Rhemi Balbach A, PA-C 11/20/20 1122    Horton, Alvin Critchley, DO 11/20/20 1433

## 2020-11-20 NOTE — ED Triage Notes (Signed)
Per pt, states B/L hand, leg and feet tingling since Sunday-states she fell out of bed this am when getting up to bathroom

## 2020-11-20 NOTE — ED Notes (Signed)
Pt reports her brother will be driving her home.

## 2020-11-21 ENCOUNTER — Encounter: Payer: Self-pay | Admitting: Neurology

## 2021-01-22 ENCOUNTER — Other Ambulatory Visit (INDEPENDENT_AMBULATORY_CARE_PROVIDER_SITE_OTHER): Payer: Self-pay

## 2021-01-22 ENCOUNTER — Ambulatory Visit (INDEPENDENT_AMBULATORY_CARE_PROVIDER_SITE_OTHER): Payer: Self-pay | Admitting: Neurology

## 2021-01-22 ENCOUNTER — Encounter: Payer: Self-pay | Admitting: Neurology

## 2021-01-22 ENCOUNTER — Other Ambulatory Visit: Payer: Self-pay

## 2021-01-22 VITALS — BP 112/78 | HR 110 | Ht 66.5 in | Wt 245.0 lb

## 2021-01-22 DIAGNOSIS — G629 Polyneuropathy, unspecified: Secondary | ICD-10-CM

## 2021-01-22 LAB — FOLATE: Folate: 19.8 ng/mL (ref >5.9)

## 2021-01-22 MED ORDER — GABAPENTIN 300 MG PO CAPS: ORAL_CAPSULE | ORAL | 3 refills | Status: DC

## 2021-01-22 NOTE — Progress Notes (Signed)
[facility] Clinic Note - Initial Visit   Date: 01/22/21  Connie Malone MRN: [id number] DOB: [birth year]   Dear Dr. Pearline Cables:  Thank you for your kind referral of Connie Malone for consultation of numbness/tingling. Although her history is well known to you, please allow [place] to reiterate it for the purpose of our medical record. The patient was accompanied to the clinic by self.    History of Present Illness: Connie Malone is a 41 y.o. right-handed female with Crohn's disease (June 2021), iron deficiency anemia, former smoker presenting for evaluation of numbness/tingling of the hands and feet  Starting in early 2022, she began having numbness/tingling involving the hands and feet. Symptoms are constant with no exacerbating or alleviating factors.  Balance is fair. She has had two falls.  Walks unassisted.  She denies arm or leg weakness. She complains of intermittent cramps in the lower legs and feet.  She has been on Humira for Crohn's disease for the past year. Recent labs shows vitamin B12 deficiency (148), she has started oral supplementation.  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 Previously she was drinking alcohol almost everyday, but that was several years ago. Now,she drinks only occasionally.  She lives with her mother.   Out-side paper records, electronic medical record, and images have been reviewed where available and summarized as:  Labs 12/24/2020: 148*  Past Medical History:  Diagnosis Date   Anemia    Bladder mass    Blood transfusion without reported diagnosis    Crohn disease ([place])    Intestinal obstruction ([place])    Iron deficiency anemia    Microcytic anemia     Past Surgical History:  Procedure Laterality Date   CYSTOSCOPY       Medications:  Outpatient Encounter Medications as of 01/22/2021  Medication Sig   acetaminophen (TYLENOL) 325 MG tablet Take 650 mg by mouth every 6 (six) hours as needed for mild  pain, fever or headache.   Adalimumab 40 MG/0.4ML PNKT Inject 0.4 mLs into the skin every 14 (fourteen) days.   Fe Fum-Fe Poly-Vit C-Lactobac (FUSION PO) Take 1 capsule by mouth every morning.   ferrous sulfate 325 (65 FE) MG tablet Take 1 tablet by mouth daily.   methocarbamol (ROBAXIN) 500 MG tablet Take 2 tablets (1,000 mg total) by mouth 2 (two) times daily.   Multiple Vitamin (MULTI-VITAMIN) tablet Take 1 tablet by mouth daily.   oxybutynin (DITROPAN-XL) 10 MG 24 hr tablet Take 1 tablet by mouth daily.   Potassium 75 MG TABS Take by mouth.   traMADol (ULTRAM) 50 MG tablet Take 1 tablet (50 mg total) by mouth every 6 (six) hours as needed.   vitamin B-12 (CYANOCOBALAMIN) 500 MCG tablet Take 500 mcg by mouth daily.   [DISCONTINUED] clindamycin (CLEOCIN) 150 MG capsule Take 1 capsule (150 mg total) by mouth every 6 (six) hours. (Patient not taking: Reported on 01/11/2014)   [DISCONTINUED] fluticasone (FLONASE) 50 MCG/ACT nasal spray Place 2 sprays into both nostrils daily. (Patient not taking: Reported on 03/06/2020)   [DISCONTINUED] oxyCODONE-acetaminophen (PERCOCET/ROXICET) 5-325 MG per tablet Take 1 tablet by mouth every 4 (four) hours as needed for moderate pain or severe pain. (Patient not taking: Reported on 03/06/2020)   [DISCONTINUED] pseudoephedrine (SUDAFED 12 HOUR) 120 MG 12 hr tablet Take 1 tablet (120 mg total) by mouth every 12 (twelve) hours. (Patient not taking: Reported on 03/06/2020)   [DISCONTINUED] sulfamethoxazole-trimethoprim (BACTRIM DS) 800-160 MG tablet Take 1  tablet by mouth 2 (two) times daily. (Patient not taking: Reported on 01/22/2021)   [DISCONTINUED] triamcinolone ointment (KENALOG) 0.5 % Apply 1 application topically 2 (two) times daily. (Patient not taking: Reported on 03/06/2020)   No facility-administered encounter medications on file as of 01/22/2021.    Allergies: No Known Allergies  Family History: Family History  Problem Relation Age of Onset   Parkinson's  disease Mother    Heart Problems Father     Social History: Social History   Tobacco Use   Smoking status: Former   Smokeless tobacco: Never  Scientific laboratory technician Use: Never used  Substance Use Topics   Alcohol use: Yes    Comment: occasional   Drug use: No   Social History   Social History Narrative   Right Handed   Lives in a one story home     Vital Signs:  BP 112/78    Pulse (!) 110    Ht 5' 6.5" (1.689 m)    Wt 245 lb (111.1 kg)    SpO2 100%    BMI 38.95 kg/m   Neurological Exam: MENTAL STATUS including orientation to time, place, person, recent and remote memory, attention span and concentration, language, and fund of knowledge is normal.  Speech is not dysarthric.  CRANIAL NERVES: II:  No visual field defects.    III-IV-VI: Pupils equal round and reactive to light.  Normal conjugate, extra-ocular eye movements in all directions of gaze.  No nystagmus.  No ptosis.   V:  Normal facial sensation.    VII:  Normal facial symmetry and movements.   VIII:  Normal hearing and vestibular function.   IX-X:  Normal palatal movement.   XI:  Normal shoulder shrug and head rotation.   XII:  Normal tongue strength and range of motion, no deviation or fasciculation.  MOTOR:  No atrophy, fasciculations or abnormal movements.  No pronator drift.   Upper Extremity:  Right  Left  Deltoid  5/5   5/5   Biceps  5/5   5/5   Triceps  5/5   5/5   Infraspinatus 5/5  5/5  Medial pectoralis 5/5  5/5  Wrist extensors  5/5   5/5   Wrist flexors  5/5   5/5   Finger extensors  5/5   5/5   Finger flexors  5/5   5/5   Dorsal interossei  5-/5   5-/5   Abductor pollicis  5/5   5/5   Tone (Ashworth scale)  0  0   Lower Extremity:  Right  Left  Hip flexors  5/5   5/5   Hip extensors  5/5   5/5   Adductor 5/5  5/5  Abductor 5/5  5/5  Knee flexors  5/5   5/5   Knee extensors  5/5   5/5   Dorsiflexors  5/5   5/5   Plantarflexors  5/5   5/5   Toe extensors  5-/5   5-/5   Toe flexors  5/5    5/5   Tone (Ashworth scale)  0  0   MSRs:  Right        Left                  brachioradialis 2+  2+  biceps 2+  2+  triceps 2+  2+  patellar 2+  2+  ankle jerk 0  0  Hoffman no  no  plantar response down  down   SENSORY:  Vibration, temperature and pinprick reduced in the feet. Sensation intact in the hands. Romberg's sign absent.   COORDINATION/GAIT: Normal finger-to- nose-finger.  Intact rapid alternating movements bilaterally.  Gait narrow based and stable, slow, unassisted. Stressed gait intact. Unsteady with tandem gait.   IMPRESSION: Paresthesias of the hands and feet is most suggestive of peripheral neuropathy.  Risk factors include Crohn's disease and vitamin B12 deficiency. Humira has rare side offects of demyelinating neuropathy and with the involvement of both upper and lower extremities, I will assess for this with electrodiagnostic testing.  PLAN/RECOMMENDATIONS:  Check folate, vitamin B1, copper, SPEP with IFE for secondary causes of neuropathy NCS/EMG of the right arm and leg Start gabapentin 300mg  at bedtime x 1 week, then increase to 300mg  twice daily Continue vitamin B12 1072mcg daily Information for [facility] financial assistance program provided, as she does not have insurance   Return to clinic in 3 months   Thank you for allowing me to participate in patient's care.  If I can answer any additional questions, I would be pleased to do so.    Sincerely,    Vihan Santagata K. Posey Pronto, DO

## 2021-01-22 NOTE — Patient Instructions (Addendum)
Check labs  Nerve testing of the right arm and leg.  Dont apply lotions or creams before the exam.   ELECTROMYOGRAM AND NERVE CONDUCTION STUDIES (EMG/NCS) INSTRUCTIONS  How to Prepare The neurologist conducting the EMG will need to know if you have certain medical conditions. Tell the neurologist and other EMG lab personnel if you: Have a pacemaker or any other electrical medical device Take blood-thinning medications Have hemophilia, a blood-clotting disorder that causes prolonged bleeding Bathing Take a shower or bath shortly before your exam in order to remove oils from your skin. Dont apply lotions or creams before the exam.  What to Expect Youll likely be asked to change into a hospital gown for the procedure and lie down on an examination table. The following explanations can help you understand what will happen during the exam.  Electrodes. The neurologist or a technician places surface electrodes at various locations on your skin depending on where youre experiencing symptoms. Or the neurologist may insert needle electrodes at different sites depending on your symptoms.  Sensations. The electrodes will at times transmit a tiny electrical current that you may feel as a twinge or spasm. The needle electrode may cause discomfort or pain that usually ends shortly after the needle is removed. If you are concerned about discomfort or pain, you may want to talk to the neurologist about taking a short break during the exam.  Instructions. During the needle EMG, the neurologist will assess whether there is any spontaneous electrical activity when the muscle is at rest - activity that isnt present in healthy muscle tissue - and the degree of activity when you slightly contract the muscle.  He or she will give you instructions on resting and contracting a muscle at appropriate times. Depending on what muscles and nerves the neurologist is examining, he or she may ask you to change positions  during the exam.  After your EMG You may experience some temporary, minor bruising where the needle electrode was inserted into your muscle. This bruising should fade within several days. If it persists, contact your primary care doctor.

## 2021-01-28 LAB — PROTEIN ELECTROPHORESIS, SERUM
Albumin ELP: 3.6 g/dL — ABNORMAL LOW (ref 3.8–4.8)
Alpha 1: 0.3 g/dL (ref 0.2–0.3)
Alpha 2: 0.8 g/dL (ref 0.5–0.9)
Beta 2: 0.5 g/dL (ref 0.2–0.5)
Beta Globulin: 0.4 g/dL (ref 0.4–0.6)
Gamma Globulin: 1.5 g/dL (ref 0.8–1.7)
Total Protein: 7.2 g/dL (ref 6.1–8.1)

## 2021-01-28 LAB — IMMUNOFIXATION ELECTROPHORESIS
IgG (Immunoglobin G), Serum: 1635 mg/dL (ref 600–1640)
IgM, Serum: 176 mg/dL (ref 50–300)
Immunofix Electr Int: NOT DETECTED
Immunoglobulin A: 455 mg/dL — ABNORMAL HIGH (ref 47–310)

## 2021-01-28 LAB — VITAMIN B1: Vitamin B1 (Thiamine): 21 nmol/L (ref 8–30)

## 2021-01-28 LAB — COPPER, SERUM: Copper: 153 ug/dL (ref 70–175)

## 2021-02-03 ENCOUNTER — Ambulatory Visit: Payer: Medicaid Other | Admitting: Neurology

## 2021-02-05 DIAGNOSIS — N939 Abnormal uterine and vaginal bleeding, unspecified: Secondary | ICD-10-CM | POA: Insufficient documentation

## 2021-02-05 DIAGNOSIS — F32A Depression, unspecified: Secondary | ICD-10-CM | POA: Insufficient documentation

## 2021-02-05 DIAGNOSIS — Z975 Presence of (intrauterine) contraceptive device: Secondary | ICD-10-CM | POA: Insufficient documentation

## 2021-02-05 LAB — HM PAP SMEAR

## 2021-02-25 ENCOUNTER — Telehealth: Payer: Self-pay | Admitting: Neurology

## 2021-02-25 ENCOUNTER — Ambulatory Visit (INDEPENDENT_AMBULATORY_CARE_PROVIDER_SITE_OTHER): Payer: Self-pay | Admitting: Neurology

## 2021-02-25 ENCOUNTER — Other Ambulatory Visit: Payer: Self-pay

## 2021-02-25 DIAGNOSIS — G629 Polyneuropathy, unspecified: Secondary | ICD-10-CM

## 2021-02-25 NOTE — Telephone Encounter (Signed)
Patient informed that NCS/EMG of the arm and leg is normal.  Specifically, no evidence of large fiber neuropathy.    She would like to proceed with further testing, so will refer her for skin biopsy for small fiber neuropathy.  Breyton Vanscyoc K. Posey Pronto, DO

## 2021-02-25 NOTE — Procedures (Signed)
[facility]  [address], [address]  [place], [place] [address] Tel: [phone] Fax:  [phone] Test Date:  02/25/2021  Patient: Connie Malone DOB: 01/02/1981 Physician: Narda Amber, DO  Sex: Female Height: 5\' 6"  Ref Phys: Narda Amber, DO  ID#: [id number]   Technician:    Patient Complaints: This is a 41 year old female with Crohn's disease referred for evaluation of paresthesias of the hands and feet.  NCV & EMG Findings: Extensive electrodiagnostic testing of the right upper and lower extremities shows: All sensory responses including the right median, ulnar, mixed palmar, sural, and superficial peroneal nerves are within normal limits All motor responses including the right median, ulnar, peroneal, and tibial nerves are within normal limits. Right tibial H reflex study is within normal limits. There is no evidence of active or chronic motor axonal loss changes affecting any of the tested muscles.  Motor unit configuration and recruitment pattern is within normal limits.  Impression: This is a normal study of the right upper and lower extremities.  In particular, there is no evidence of a large fiber sensorimotor polyneuropathy or cervical/lumbosacral radiculopathy.   ___________________________ Narda Amber, DO    Nerve Conduction Studies Anti Sensory Summary Table   Stim Site NR Peak (ms) Norm Peak (ms) P-T Amp (V) Norm P-T Amp  Right Median Anti Sensory (2nd Digit)  36C  Wrist    3.0 <3.4 36.9 >20  Right Sup Peroneal Anti Sensory (Ant Lat Mall)  36C  12 cm    2.6 <4.5 11.0 >5  Right Sural Anti Sensory (Lat Mall)  36C  Calf    2.8 <4.5 10.4 >5  Right Ulnar Anti Sensory (5th Digit)  36C  Wrist    2.7 <3.1 32.2 >12   Motor Summary Table   Stim Site NR Onset (ms) Norm Onset (ms) O-P Amp (mV) Norm O-P Amp Site1 Site2 Delta-0 (ms) Dist (cm) Vel (m/s) Norm Vel (m/s)  Right Median Motor (Abd Poll Brev)  36C  Wrist    3.0 <3.9 10.1 >6 Elbow Wrist  5.0 29.0 58 >50  Elbow    8.0  10.0         Right Peroneal Motor (Ext Dig Brev)  36C  Ankle    3.6 <5.5 8.4 >3 B Fib Ankle 8.0 38.0 48 >40  B Fib    11.6  8.7  Poplt B Fib 1.4 8.0 57 >40  Poplt    13.0  8.4         Right Tibial Motor (Abd Hall Brev)  36C  Ankle    2.3 <6.0 10.1 >8 Knee Ankle 9.7 44.0 45 >40  Knee    12.0  9.2         Right Ulnar Motor (Abd Dig Minimi)  36C  Wrist    2.0 <3.1 9.5 >7 B Elbow Wrist 3.4 22.0 65 >50  B Elbow    5.4  9.1  A Elbow B Elbow 1.6 10.0 63 >50  A Elbow    7.0  8.5          Comparison Summary Table   Stim Site NR Peak (ms) Norm Peak (ms) P-T Amp (V) Site1 Site2 Delta-P (ms) Norm Delta (ms)  Right Median/Ulnar Palm Comparison (Wrist - 8cm)  36C  Median Palm    1.7 <2.2 27.0 Median Palm Ulnar Palm 0.1   Ulnar Palm    1.6 <2.2 16.4       H Reflex Studies   NR H-Lat (ms)  Lat Norm (ms) L-R H-Lat (ms)  Right Tibial (Gastroc)  36C     34.42 <35    EMG   Side Muscle Ins Act Fibs Psw Fasc Number Recrt Dur Dur. Amp Amp. Poly Poly. Comment  Right AntTibialis Nml Nml Nml Nml Nml Nml Nml Nml Nml Nml Nml Nml N/A  Right Gastroc Nml Nml Nml Nml Nml Nml Nml Nml Nml Nml Nml Nml N/A  Right Flex Dig Long Nml Nml Nml Nml Nml Nml Nml Nml Nml Nml Nml Nml N/A  Right RectFemoris Nml Nml Nml Nml Nml Nml Nml Nml Nml Nml Nml Nml N/A  Right BicepsFemS Nml Nml Nml Nml Nml Nml Nml Nml Nml Nml Nml Nml N/A  Right 1stDorInt Nml Nml Nml Nml Nml Nml Nml Nml Nml Nml Nml Nml N/A  Right PronatorTeres Nml Nml Nml Nml Nml Nml Nml Nml Nml Nml Nml Nml N/A  Right Biceps Nml Nml Nml Nml Nml Nml Nml Nml Nml Nml Nml Nml N/A  Right Triceps Nml Nml Nml Nml Nml Nml Nml Nml Nml Nml Nml Nml N/A  Right Deltoid Nml Nml Nml Nml Nml Nml Nml Nml Nml Nml Nml Nml N/A      Waveforms:

## 2021-03-12 ENCOUNTER — Ambulatory Visit: Payer: Self-pay | Admitting: Podiatry

## 2021-04-23 ENCOUNTER — Ambulatory Visit: Payer: Self-pay | Admitting: Neurology

## 2021-04-23 ENCOUNTER — Encounter: Payer: Self-pay | Admitting: Neurology

## 2021-04-23 DIAGNOSIS — Z029 Encounter for administrative examinations, unspecified: Secondary | ICD-10-CM

## 2021-07-30 ENCOUNTER — Other Ambulatory Visit: Payer: Self-pay

## 2021-07-30 ENCOUNTER — Emergency Department (HOSPITAL_COMMUNITY)
Admission: EM | Admit: 2021-07-30 | Discharge: 2021-07-30 | Disposition: A | Discharge status: Home / Self Care | Payer: Self-pay | Attending: Emergency Medicine | Admitting: Emergency Medicine

## 2021-07-30 ENCOUNTER — Emergency Department (HOSPITAL_COMMUNITY): Payer: Self-pay

## 2021-07-30 ENCOUNTER — Emergency Department (HOSPITAL_COMMUNITY): Payer: Medicaid Other

## 2021-07-30 ENCOUNTER — Encounter (HOSPITAL_COMMUNITY): Payer: Self-pay

## 2021-07-30 DIAGNOSIS — M25552 Pain in left hip: Secondary | ICD-10-CM | POA: Insufficient documentation

## 2021-07-30 DIAGNOSIS — H60501 Unspecified acute noninfective otitis externa, right ear: Secondary | ICD-10-CM | POA: Insufficient documentation

## 2021-07-30 LAB — CBC WITH DIFFERENTIAL/PLATELET
Abs Immature Granulocytes: 0.01 10*3/uL (ref 0.00–0.07)
Basophils Absolute: 0 10*3/uL (ref 0.0–0.1)
Basophils Relative: 1 %
Eosinophils Absolute: 0 10*3/uL (ref 0.0–0.5)
Eosinophils Relative: 1 %
HCT: 35.1 % — ABNORMAL LOW (ref 36.0–46.0)
Hemoglobin: 10.6 g/dL — ABNORMAL LOW (ref 12.0–15.0)
Immature Granulocytes: 0 %
Lymphocytes Relative: 22 %
Lymphs Abs: 1 10*3/uL (ref 0.7–4.0)
MCH: 24 pg — ABNORMAL LOW (ref 26.0–34.0)
MCHC: 30.2 g/dL (ref 30.0–36.0)
MCV: 79.4 fL — ABNORMAL LOW (ref 80.0–100.0)
Monocytes Absolute: 0.3 10*3/uL (ref 0.1–1.0)
Monocytes Relative: 7 %
Neutro Abs: 3 10*3/uL (ref 1.7–7.7)
Neutrophils Relative %: 69 %
Platelets: 346 10*3/uL (ref 150–400)
RBC: 4.42 MIL/uL (ref 3.87–5.11)
RDW: 18.1 % — ABNORMAL HIGH (ref 11.5–15.5)
WBC: 4.3 10*3/uL (ref 4.0–10.5)
nRBC: 0 % (ref 0.0–0.2)

## 2021-07-30 LAB — PREGNANCY, URINE: Preg Test, Ur: NEGATIVE

## 2021-07-30 MED ORDER — OFLOXACIN 0.3 % OT SOLN: 3.0000 [drp] | Freq: Two times a day (BID) | OTIC | 0 refills | Status: AC

## 2021-07-30 MED ORDER — ACETAMINOPHEN 325 MG PO TABS
650.0000 mg | ORAL_TABLET | Freq: Once | ORAL | Status: AC
Start: 2021-07-30 — End: 2021-07-30
  Administered 2021-07-30: 650 mg via ORAL
  Filled 2021-07-30: qty 2

## 2021-07-30 MED ORDER — CEPHALEXIN 500 MG PO CAPS: 500.0000 mg | ORAL_CAPSULE | Freq: Three times a day (TID) | ORAL | 0 refills | Status: AC

## 2021-07-30 NOTE — ED Provider Triage Note (Signed)
Emergency Medicine Provider Triage Evaluation Note  Connie Malone , a 41 y.o. female  was evaluated in triage.  Pt complains of right ear pain for the past 5 days, had a small pimple which busted but now the pimple has returned.  She does wear air pods at baseline.  Also endorses pain along the left leg exacerbated with any ambulation, feeling a cramping sensation from her hip down to her leg, taken Tylenol Extra Strength without much improvement in symptoms..  Review of Systems  Positive: Right ear pain, left leg pain Negative: Trauma, fever  Physical Exam  BP (!) 144/81 (BP Location: Left Arm)   Pulse 87   Temp 98.8 F (37.1 C) (Oral)   Resp 18   Ht 5' 6.5" (1.689 m)   Wt 108.9 kg   SpO2 99%   BMI 38.16 kg/m  Gen:   Awake, no distress   Resp:  Normal effort  MSK:   Moves extremities without difficulty  Other:  Visible pimple located to the right ear with pus and fluctuance.  Full range of motion of the left leg ambulatory with antalgic gait.  Medical Decision Making  Medically screening exam initiated at 12:28 PM.  Appropriate orders placed.  Connie Malone was informed that the remainder of the evaluation will be completed by another provider, this initial triage assessment does not replace that evaluation, and the importance of remaining in the ED until their evaluation is complete.     Janeece Fitting, PA-C 07/30/21 1228

## 2021-07-30 NOTE — ED Provider Notes (Signed)
[facility] DEPT Provider Note   CSN: [id number] Arrival date & time: 07/30/21  1153     History  Chief Complaint  Patient presents with   Hip Pain   Otalgia    [place] is a 41 y.o. female.  Patient complains of pain to the right ear for 5 days, also associate with left hip pain for 2 days.  She denies any fevers or cough or vomiting or diarrhea no congestion.  Denies any falls or traumas.  She states that her mother looked in her ear right ear and saw a small pimple in her ear which she suspects is the cause of her pain.  She otherwise denies any instrumentation to the ear.  She also states that her left hip sometimes hurts when her iron levels are low.  Otherwise denies any blood in her stool or any vomiting of blood.       Home Medications Prior to Admission medications   Medication Sig Start Date End Date Taking? Authorizing Provider  cephALEXin (KEFLEX) 500 MG capsule Take 1 capsule (500 mg total) by mouth 3 (three) times daily for 7 days. 07/30/21 08/06/21 Yes Luna Fuse, MD  ofloxacin (FLOXIN) 0.3 % OTIC solution Place 3 drops into the right ear 2 (two) times daily for 7 days. 07/30/21 08/06/21 Yes Luna Fuse, MD  acetaminophen (TYLENOL) 325 MG tablet Take 650 mg by mouth every 6 (six) hours as needed for mild pain, fever or headache.    [provider]  Adalimumab 40 MG/0.4ML PNKT Inject 0.4 mLs into the skin every 14 (fourteen) days. 07/27/19   [provider]  Fe Fum-Fe Poly-Vit C-Lactobac (FUSION PO) Take 1 capsule by mouth every morning.    [provider]  ferrous sulfate 325 (65 FE) MG tablet Take 1 tablet by mouth daily. 06/17/17   [provider]  gabapentin (NEURONTIN) 300 MG capsule Take 1 tablet at bedtime x 1 week, then increase to 1 tablet twice daily. 01/22/21   Patel, Arvin Collard K, DO  methocarbamol (ROBAXIN) 500 MG tablet Take 2 tablets (1,000 mg total) by mouth 2 (two) times daily. 11/20/20    Jeanell Sparrow, DO  Multiple Vitamin (MULTI-VITAMIN) tablet Take 1 tablet by mouth daily.    [provider]  oxybutynin (DITROPAN-XL) 10 MG 24 hr tablet Take 1 tablet by mouth daily. 08/25/19   [provider]  Potassium 75 MG TABS Take by mouth.    [provider]  traMADol (ULTRAM) 50 MG tablet Take 1 tablet (50 mg total) by mouth every 6 (six) hours as needed. 09/09/20   Couture, Cortni S, PA-C  vitamin B-12 (CYANOCOBALAMIN) 500 MCG tablet Take 500 mcg by mouth daily.    [provider]      Allergies    Latex    Review of Systems   Review of Systems  Constitutional:  Negative for fever.  HENT:  Positive for ear pain.   Eyes:  Negative for pain.  Respiratory:  Negative for cough.   Cardiovascular:  Negative for chest pain.  Gastrointestinal:  Negative for abdominal pain.  Genitourinary:  Negative for flank pain.  Musculoskeletal:  Negative for back pain.  Skin:  Negative for rash.  Neurological:  Negative for headaches.    Physical Exam Updated Vital Signs BP (!) 122/92 (BP Location: Left Arm)   Pulse 72   Temp 98.2 F (36.8 C) (Oral)   Resp 16   Ht 5' 6.5" (1.689  m)   Wt 108.9 kg   LMP 07/30/2021 (Exact Date)   SpO2 100%   BMI 38.16 kg/m  Physical Exam Constitutional:      General: She is not in acute distress.    Appearance: Normal appearance.  HENT:     Head: Normocephalic.     Ears:     Comments: TMs appear clear bilaterally.  Right ear has a small pustule forming with some soft tissue swelling.  No mastoid tenderness or bogginess noted.  No swelling or erythema seen on the neck or other areas other than the external canal of the ear.    Nose: Nose normal.  Eyes:     Extraocular Movements: Extraocular movements intact.  Cardiovascular:     Rate and Rhythm: Normal rate.  Pulmonary:     Effort: Pulmonary effort is normal.  Musculoskeletal:        General: Normal range of motion.     Cervical back: Normal range of motion.      Comments: Mild antalgia with range of motion of the left hip but neurovascularly intact.  Able to flex her hips and knees and ankles with minimal to moderate pain.  Patient ambulatory without assistance.  Neurological:     General: No focal deficit present.     Mental Status: She is alert. Mental status is at baseline.     ED Results / Procedures / Treatments   Labs (all labs ordered are listed, but only abnormal results are displayed) Labs Reviewed  CBC WITH DIFFERENTIAL/PLATELET - Abnormal; Notable for the following components:      Result Value   Hemoglobin 10.6 (*)    HCT 35.1 (*)    MCV 79.4 (*)    MCH 24.0 (*)    RDW 18.1 (*)    All other components within normal limits  PREGNANCY, URINE    EKG None  Radiology DG Hip Unilat W or Wo Pelvis 2-3 Views Left  Result Date: 07/30/2021 CLINICAL DATA:  Hip pain EXAM: DG HIP (WITH OR WITHOUT PELVIS) 3V LEFT COMPARISON:  None Available. FINDINGS: There is no evidence of hip fracture or dislocation. There is no evidence of arthropathy or other focal bone abnormality. IMPRESSION: Negative. Electronically Signed   By: Yetta Glassman M.D.   On: 07/30/2021 14:55    Procedures Procedures    Medications Ordered in ED Medications  acetaminophen (TYLENOL) tablet 650 mg (650 mg Oral Given 07/30/21 1716)    ED Course/ Medical Decision Making/ A&P                           Medical Decision Making Amount and/or Complexity of Data Reviewed Labs: ordered.  Risk OTC drugs.   Labs are sent hemoglobin appears normal.  X-rays unremarkable of the left hip.  Patient advised outpatient follow-up with ENT in a week.  Given a prescription of eardrops to take in the meanwhile.  Recommended continue Tylenol and Motrin at home.        Final Clinical Impression(s) / ED Diagnoses Final diagnoses:  Acute otitis externa of right ear, unspecified type  Left hip pain    Rx / DC Orders ED Discharge Orders          Ordered     ofloxacin (FLOXIN) 0.3 % OTIC solution  2 times daily        07/30/21 1818    cephALEXin (KEFLEX) 500 MG capsule  3 times daily  07/30/21 1818              Luna Fuse, MD 07/30/21 1818

## 2021-07-30 NOTE — Discharge Instructions (Addendum)
Call your primary care doctor or specialist as discussed in the next 2-3 days.   Return immediately back to the ER if:  Your symptoms worsen within the next 12-24 hours. You develop new symptoms such as new fevers, persistent vomiting, new pain, shortness of breath, or new weakness or numbness, or if you have any other concerns.  

## 2021-07-30 NOTE — ED Triage Notes (Signed)
Pt c/o left hip pain x 2 days denies injury. States she sometimes has this pain when her iron levels are low. She also c/o pain to her right ear x 5 days.

## 2021-12-16 ENCOUNTER — Encounter: Payer: Self-pay | Admitting: Podiatry

## 2021-12-16 ENCOUNTER — Ambulatory Visit (INDEPENDENT_AMBULATORY_CARE_PROVIDER_SITE_OTHER): Payer: Medicare Other | Admitting: Podiatry

## 2021-12-16 DIAGNOSIS — G609 Hereditary and idiopathic neuropathy, unspecified: Secondary | ICD-10-CM | POA: Diagnosis not present

## 2021-12-16 DIAGNOSIS — E559 Vitamin D deficiency, unspecified: Secondary | ICD-10-CM | POA: Diagnosis not present

## 2021-12-17 NOTE — Progress Notes (Signed)
  Subjective:  Patient ID: Connie Malone, female    DOB: [birth year],  MRN: [id number]  Chief Complaint  Patient presents with   Peripheral Neuropathy    New Patient - feet are numb and cold all the time. Her neurologist recommended our office. She takes gabapentin QHS    41 y.o. female presents with the above complaint. History confirmed with patient.  She was referred to [place] for a small fiber nerve biopsy.  She does not have much pain is mostly numbness and feeling of cold.  She recently had some lab work done  Objective:  Physical Exam: Foot is warm at the ankle to the level of the toes which is little bit cooler no trophic changes or ulcerative lesions, normal DP and PT pulses, and sensation intact to light touch.  Assessment:  No diagnosis found.   Plan:  Patient was evaluated and treated and all questions answered.  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  She does not have any evidence of alcoholic neuropathy from her history.  Her B12 level was recently checked and is on the lower side.  She does have a number of issues with bleeding and low iron binding as well as significantly low vitamin D level.  I do think endocrinology evaluation may be beneficial for her and I sent a referral for this.  Also recommended evaluation of noninvasive vascular examination.  I discussed with her that we could proceed with a small fiber nerve biopsy and what this would entail.  We discussed with her this may provide a diagnosis but likely may not provide any new treatment options or recommendations other than what she is currently doing.  I will see her back as needed if she would like to proceed with a biopsy  Return if symptoms worsen or fail to improve.

## 2021-12-18 ENCOUNTER — Encounter (HOSPITAL_COMMUNITY): Payer: Medicare Other

## 2021-12-22 NOTE — Progress Notes (Signed)
They are short providers so it looks like they denied the referral request.

## 2021-12-24 ENCOUNTER — Ambulatory Visit (HOSPITAL_COMMUNITY)
Admission: RE | Admit: 2021-12-24 | Discharge: 2021-12-24 | Disposition: A | Discharge status: Home / Self Care | Payer: Medicare Other | Source: Ambulatory Visit | Attending: Podiatry | Admitting: Podiatry

## 2021-12-24 DIAGNOSIS — G609 Hereditary and idiopathic neuropathy, unspecified: Secondary | ICD-10-CM | POA: Diagnosis present

## 2022-02-09 ENCOUNTER — Ambulatory Visit (INDEPENDENT_AMBULATORY_CARE_PROVIDER_SITE_OTHER): Payer: Medicare Other | Admitting: Family Medicine

## 2022-02-09 ENCOUNTER — Other Ambulatory Visit (HOSPITAL_COMMUNITY)
Admission: RE | Admit: 2022-02-09 | Discharge: 2022-02-09 | Disposition: A | Discharge status: Home / Self Care | Payer: Medicare Other | Source: Ambulatory Visit | Attending: Family Medicine | Admitting: Family Medicine

## 2022-02-09 ENCOUNTER — Encounter: Payer: Self-pay | Admitting: Family Medicine

## 2022-02-09 VITALS — BP 130/87 | HR 83 | Ht 66.5 in | Wt 253.6 lb

## 2022-02-09 DIAGNOSIS — Z975 Presence of (intrauterine) contraceptive device: Secondary | ICD-10-CM | POA: Diagnosis not present

## 2022-02-09 DIAGNOSIS — Z01419 Encounter for gynecological examination (general) (routine) without abnormal findings: Secondary | ICD-10-CM | POA: Diagnosis present

## 2022-02-09 DIAGNOSIS — N921 Excessive and frequent menstruation with irregular cycle: Secondary | ICD-10-CM | POA: Diagnosis not present

## 2022-02-09 DIAGNOSIS — Z0000 Encounter for general adult medical examination without abnormal findings: Secondary | ICD-10-CM | POA: Diagnosis not present

## 2022-02-09 DIAGNOSIS — N939 Abnormal uterine and vaginal bleeding, unspecified: Secondary | ICD-10-CM

## 2022-02-09 MED ORDER — IBUPROFEN 800 MG PO TABS: 800.0000 mg | ORAL_TABLET | Freq: Three times a day (TID) | ORAL | 0 refills | Status: AC

## 2022-02-09 MED ORDER — LORAZEPAM 1 MG PO TABS: 1.0000 mg | ORAL_TABLET | Freq: Once | ORAL | 0 refills | Status: AC

## 2022-02-09 NOTE — Progress Notes (Signed)
New pt presents for annual exam and to est care. Pt had IUD placed in September 2023, and has had irregular spotting since. Denies any abnormal vaginal discharge or odor. Pt reports having cyst in her groin area.  Last PAP August 2023, and was normal per patient. Pt hasn't had a mammogram. Denies any abnormal breast changes.  PHQ - 12 GAD - 12

## 2022-02-09 NOTE — Progress Notes (Signed)
ANNUAL EXAM Patient name: Connie Malone MRN [id number]  Date of birth: 12-14-1980 Chief Complaint:   No chief complaint on file.  History of Present Illness:   Connie Malone is a 42 y.o. G0P0000 African-American female being seen today for a routine annual exam.  Current complaints: bleeding on IUD since Sept 2023.  H/o heavy menstrual bleeding, and had the IUD placed for this. She has been spotting since the IUD has bene placed, but the heavy bleeding has stopped. Still has periods, lasting 2 weeks.   Is on Humnira  Crohn's disease. Had colon obstruction on 2022 and surgery due to it. She is able to take ibuprofen for short periods.  Patient's last menstrual period was 01/25/2022 (exact date).   Upstream - 02/09/22 1041       Pregnancy Intention Screening   Does the patient want to become pregnant in the next year? Ok Either Way    Does the patient's partner want to become pregnant in the next year? N/A    Would the patient like to discuss contraceptive options today? No      Contraception Wrap Up   Current Method IUD or IUS            The pregnancy intention screening data noted above was reviewed. Potential methods of contraception were discussed. The patient elected to proceed with No data recorded.   Last pap 02/06/2023. Results were: NILM w/ HRHPV negative. H/O abnormal pap: no Last mammogram: none Last colonoscopy: 07/05/19. Pt has Crohn's     02/09/2022   10:41 AM  Depression screen PHQ 2/9  Decreased Interest 3  Down, Depressed, Hopeless 1  PHQ - 2 Score 4  Altered sleeping 3  Tired, decreased energy 0  Change in appetite 1  Feeling bad or failure about yourself  1  Trouble concentrating 3  Moving slowly or fidgety/restless 0  Suicidal thoughts 0  PHQ-9 Score 12  Difficult doing work/chores Somewhat difficult        02/09/2022   10:44 AM  GAD 7 : Generalized Anxiety Score  Nervous, Anxious, on Edge 3  Control/stop worrying 0  Worry too much -  different things 3  Trouble relaxing 3  Restless 0  Easily annoyed or irritable 3  Afraid - awful might happen 0  Total GAD 7 Score 12  Anxiety Difficulty Somewhat difficult     Review of Systems:   Pertinent items are noted in HPI Denies any headaches, blurred vision, fatigue, shortness of breath, chest pain, abdominal pain, abnormal vaginal discharge/itching/odor/irritation, problems with periods, bowel movements, urination, or intercourse unless otherwise stated above. Pertinent History Reviewed:  Reviewed past medical,surgical, social and family history.  Reviewed problem list, medications and allergies. Physical Assessment:   Vitals:   02/09/22 1032  BP: 130/87  Pulse: 83  Weight: 253 lb 9.6 oz (115 kg)  Height: 5' 6.5" (1.689 m)  Body mass index is 40.32 kg/m.        Physical Examination:   General appearance - well appearing, and in no distress  Mental status - alert, oriented to person, place, and time  Psych:  She has a normal mood and affect  Skin - warm and dry, normal color, no suspicious lesions noted  Chest - effort normal, all lung fields clear to auscultation bilaterally  Heart - normal rate and regular rhythm  Neck:  midline trachea, no thyromegaly or nodules  Breasts - breasts appear normal, no suspicious masses, no skin or nipple changes  or  axillary nodes  Abdomen - soft, nontender, nondistended, no masses or organomegaly  Pelvic - VULVA: normal appearing vulva with no masses, tenderness or lesions  VAGINA: normal appearing vagina with normal color and discharge, no lesions  CERVIX: normal appearing cervix without discharge or lesions, no CMT  Thin prep pap is done with HR HPV cotesting  UTERUS: uterus is felt to be normal size, shape, consistency and nontender   ADNEXA: No adnexal masses or tenderness noted.  Rectal - normal rectal, good sphincter tone, no masses felt. Hemoccult: none  Extremities:  No swelling or varicosities noted  Chaperone present  for exam  No results found for this or any previous visit (from the past 24 hour(s)).  Assessment & Plan:  1) Well-Woman Exam  2) AUB  Pt seen for pap and AUB with IUD. She has a h/o heavy bleeds but does note vaginal spotting with IUD in place. She has chronic anemia 2/2 Crohn's. Labs and [place] ordered. EMB 2/2 to her age and risk factors for dysplasia.  Mammogram:  ordered Colonoscopy: per GI  Orders Placed This Encounter  Procedures   US PELVIC COMPLETE WITH TRANSVAGINAL   TSH + free T4   CBC w/Diff   FSH   Estradiol    Meds:  Meds ordered this encounter  Medications   ibuprofen (ADVIL) 800 MG tablet    Sig: Take 1 tablet (800 mg total) by mouth 3 (three) times daily for 7 days.    Dispense:  21 tablet    Refill:  0    Follow-up: Return in about 4 weeks (around 03/09/2022) for ENDOMETRIAL BIOPSY.  [place], DO 02/09/2022 11:43 AM

## 2022-02-10 LAB — CBC WITH DIFFERENTIAL/PLATELET
Basophils Absolute: 0 10*3/uL (ref 0.0–0.2)
Basos: 1 %
EOS (ABSOLUTE): 0 10*3/uL (ref 0.0–0.4)
Eos: 1 %
Hematocrit: 33.7 % — ABNORMAL LOW (ref 34.0–46.6)
Hemoglobin: 10.2 g/dL — ABNORMAL LOW (ref 11.1–15.9)
Immature Grans (Abs): 0 10*3/uL (ref 0.0–0.1)
Immature Granulocytes: 0 %
Lymphocytes Absolute: 0.8 10*3/uL (ref 0.7–3.1)
Lymphs: 23 %
MCH: 23.4 pg — ABNORMAL LOW (ref 26.6–33.0)
MCHC: 30.3 g/dL — ABNORMAL LOW (ref 31.5–35.7)
MCV: 78 fL — ABNORMAL LOW (ref 79–97)
Monocytes Absolute: 0.3 10*3/uL (ref 0.1–0.9)
Monocytes: 7 %
Neutrophils Absolute: 2.3 10*3/uL (ref 1.4–7.0)
Neutrophils: 68 %
Platelets: 428 10*3/uL (ref 150–450)
RBC: 4.35 x10E6/uL (ref 3.77–5.28)
RDW: 22.8 % — ABNORMAL HIGH (ref 11.7–15.4)
WBC: 3.4 10*3/uL (ref 3.4–10.8)

## 2022-02-10 LAB — CERVICOVAGINAL ANCILLARY ONLY
Chlamydia: NEGATIVE
Comment: NEGATIVE
Comment: NEGATIVE
Comment: NORMAL
Neisseria Gonorrhea: NEGATIVE
Trichomonas: NEGATIVE

## 2022-02-10 LAB — TSH+FREE T4
Free T4: 0.84 ng/dL (ref 0.82–1.77)
TSH: 0.556 u[IU]/mL (ref 0.450–4.500)

## 2022-02-10 LAB — FOLLICLE STIMULATING HORMONE: FSH: 9.7 m[IU]/mL

## 2022-02-10 LAB — ESTRADIOL: Estradiol: 162 pg/mL

## 2022-02-11 DIAGNOSIS — K432 Incisional hernia without obstruction or gangrene: Secondary | ICD-10-CM | POA: Insufficient documentation

## 2022-02-13 ENCOUNTER — Ambulatory Visit (HOSPITAL_COMMUNITY)
Admission: RE | Admit: 2022-02-13 | Discharge: 2022-02-13 | Disposition: A | Discharge status: Home / Self Care | Payer: Medicare HMO | Source: Ambulatory Visit | Attending: Family Medicine | Admitting: Family Medicine

## 2022-02-13 DIAGNOSIS — N939 Abnormal uterine and vaginal bleeding, unspecified: Secondary | ICD-10-CM | POA: Insufficient documentation

## 2022-02-13 NOTE — Progress Notes (Unsigned)
Follow-up Visit   Date: 02/16/2022    [name] MRN: [id number] DOB: 13-Feb-1980    Connie Malone Connie Malone is a 42 y.o. right-handed female with Crohn's disease (June 2021), iron deficiency anemia, former smoker returning to the clinic for follow-up of feet numbness.  The patient was accompanied to the clinic by self.   IMPRESSION/PLAN: Bilateral feet >> hand numbness. Neurological exam is normal and prior testing including NCS/EMG, MRI brain, and serology testing (TSH, vitamin B12) has been normal.  I offered skin biopsy to evaluate for small fiber neuropathy, but she opted to hold on further testing at this time and will monitor.  Because symptoms are predominately numbness, I will discontinue gabapentin, as medications are not effective in treating numbness.  Return to clinic as needed  --------------------------------------------- History of present illness: Starting in early 2022, she began having numbness/tingling involving the hands and feet. Symptoms are constant with no exacerbating or alleviating factors.  Balance is fair. She has had two falls.  Walks unassisted.  She denies arm or leg weakness. She complains of intermittent cramps in the lower legs and feet.  She has been on Humira for Crohn's disease for the past year. Recent labs shows vitamin B12 deficiency (148), she has started oral supplementation.   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 Previously she was drinking alcohol almost everyday, but that was several years ago. Now,she drinks only occasionally.  She lives with her mother.   UPDATE 02/16/2021: She was last seen in January 2023 and had NCS/EMG of the right side which was normal. She continues to complain of numbness in the feet, less so in the hands.  She has been taking gabapentin, but it keeps her awake and has not changed her numbness.  No weakness.  She reports rare low back pain.   Medications:  Current Outpatient Medications on File Prior  to Visit  Medication Sig Dispense Refill   acetaminophen (TYLENOL) 325 MG tablet Take 650 mg by mouth every 6 (six) hours as needed for mild pain, fever or headache.     Adalimumab 40 MG/0.4ML PNKT Inject 0.4 mLs into the skin every 14 (fourteen) days.     diclofenac (CATAFLAM) 50 MG tablet Take by mouth.     Fe Fum-Fe Poly-Vit C-Lactobac (FUSION PO) Take 1 capsule by mouth every morning.     ferrous sulfate 325 (65 FE) MG tablet Take 1 tablet by mouth daily.     ibuprofen (ADVIL) 800 MG tablet Take 1 tablet (800 mg total) by mouth 3 (three) times daily for 7 days. 21 tablet 0   linaclotide (LINZESS) 145 MCG CAPS capsule Take by mouth.     methocarbamol (ROBAXIN) 500 MG tablet Take 2 tablets (1,000 mg total) by mouth 2 (two) times daily. 20 tablet 0   Multiple Vitamin (MULTI-VITAMIN) tablet Take 1 tablet by mouth daily.     oxybutynin (DITROPAN-XL) 10 MG 24 hr tablet Take 1 tablet by mouth daily.     Potassium 75 MG TABS Take by mouth.     sertraline (ZOLOFT) 25 MG tablet Take 1 tablet by mouth daily.     traMADol (ULTRAM) 50 MG tablet Take 1 tablet (50 mg total) by mouth every 6 (six) hours as needed. 10 tablet 0   vitamin B-12 (CYANOCOBALAMIN) 500 MCG tablet Take 500 mcg by mouth daily.     Vitamin D, Ergocalciferol, (DRISDOL) 1.25 MG (50000 UNIT) CAPS capsule Take by mouth.  No current facility-administered medications on file prior to visit.    Allergies:  Allergies  Allergen Reactions   Latex Rash    Vital Signs:  BP 125/87   Pulse 68   Ht 5' 6.5" (1.689 m)   Wt 253 lb (114.8 kg)   LMP 01/25/2022 (Exact Date)   SpO2 95%   BMI 40.22 kg/m    Neurological Exam: MENTAL STATUS including orientation to time, place, person, recent and remote memory, attention span and concentration, language, and fund of knowledge is normal.  Speech is not dysarthric.  CRANIAL NERVES:  No visual field defects.  Pupils equal round and reactive to light.  Normal conjugate, extra-ocular eye  movements in all directions of gaze.  No ptosis .  Face is symmetric. Palate elevates symmetrically.  Tongue is midline.  MOTOR:  Motor strength is 5/5 in all extremities.  No atrophy, fasciculations or abnormal movements.  No pronator drift.  Tone is normal.    MSRs:  Reflexes are 2+/4 throughout.  SENSORY:  Intact to vibration throughout.  COORDINATION/GAIT: Gait narrow based and stable.   Data: NCS/EMG of the legs 02/25/2021:  This is a normal study of the right upper and lower extremities.  In particular, there is no evidence of a large fiber sensorimotor polyneuropathy or cervical/lumbosacral radiculopathy.   Thank you for allowing me to participate in patient's care.  If I can answer any additional questions, I would be pleased to do so.    Sincerely,    Nannette Zill K. Posey Pronto, DO

## 2022-02-16 ENCOUNTER — Ambulatory Visit (INDEPENDENT_AMBULATORY_CARE_PROVIDER_SITE_OTHER): Payer: Medicare HMO | Admitting: Neurology

## 2022-02-16 ENCOUNTER — Encounter: Payer: Self-pay | Admitting: Neurology

## 2022-02-16 VITALS — BP 125/87 | HR 68 | Ht 66.5 in | Wt 253.0 lb

## 2022-02-16 DIAGNOSIS — H43393 Other vitreous opacities, bilateral: Secondary | ICD-10-CM | POA: Insufficient documentation

## 2022-02-16 DIAGNOSIS — R200 Anesthesia of skin: Secondary | ICD-10-CM

## 2022-02-16 NOTE — Patient Instructions (Signed)
Stop gabapentin  If your symptoms get worse, contact my office and we can proceed with skin biopsy

## 2022-03-09 ENCOUNTER — Encounter: Payer: Self-pay | Admitting: Obstetrics and Gynecology

## 2022-03-09 ENCOUNTER — Ambulatory Visit (INDEPENDENT_AMBULATORY_CARE_PROVIDER_SITE_OTHER): Payer: Medicare HMO | Admitting: Obstetrics and Gynecology

## 2022-03-09 ENCOUNTER — Other Ambulatory Visit (HOSPITAL_COMMUNITY)
Admission: RE | Admit: 2022-03-09 | Discharge: 2022-03-09 | Disposition: A | Discharge status: Home / Self Care | Payer: Medicare HMO | Source: Ambulatory Visit | Attending: Obstetrics and Gynecology | Admitting: Obstetrics and Gynecology

## 2022-03-09 VITALS — BP 124/85 | HR 87 | Wt 256.0 lb

## 2022-03-09 DIAGNOSIS — D259 Leiomyoma of uterus, unspecified: Secondary | ICD-10-CM | POA: Diagnosis present

## 2022-03-09 DIAGNOSIS — N939 Abnormal uterine and vaginal bleeding, unspecified: Secondary | ICD-10-CM

## 2022-03-09 DIAGNOSIS — Z1231 Encounter for screening mammogram for malignant neoplasm of breast: Secondary | ICD-10-CM

## 2022-03-09 DIAGNOSIS — Z01812 Encounter for preprocedural laboratory examination: Secondary | ICD-10-CM

## 2022-03-09 NOTE — Progress Notes (Signed)
Pt in office today for u/s results and Endo Bx. Pt is still having bleeding - not much change since last visit.  Pt is bleeding today.  Pt has IUD in place.

## 2022-03-09 NOTE — Progress Notes (Signed)
42 yo P0 with BMI 40 and persistent AUB. Patient was seen in January for evaluation of AUB. She had Mirena IUD inserted in September 2023. She describes 10 days of heavy flow monthly followed by daily spotting with occasional heavy flow and passage of clots the rest of the month. She reports some cramping pain. She is sexually active. She desires to conceive one child. She is without any other complaints  Past Medical History:  Diagnosis Date   Anemia    Bladder mass    Blood transfusion without reported diagnosis    Crohn disease ([place])    Intestinal obstruction ([facility])    Iron deficiency anemia    Microcytic anemia    Past Surgical History:  Procedure Laterality Date   CYSTOSCOPY     Family History  Problem Relation Age of Onset   Parkinson's disease Mother    Heart Problems Father    Social History   Tobacco Use   Smoking status: Some Days    Types: Cigarettes   Smokeless tobacco: Never  Vaping Use   Vaping Use: Never used  Substance Use Topics   Alcohol use: Yes    Comment: occasional   Drug use: No   ROS See pertinent in HPI. All other systems reviewed and non contributory Blood pressure 124/85, pulse 87, weight 256 lb (116.1 kg), last menstrual period 01/25/2022. GENERAL: Well-developed, well-nourished female in no acute distress.  ABDOMEN: Soft, nontender, nondistended. No organomegaly. PELVIC: Normal external female genitalia. Vagina is pink and rugated.  Normal discharge. Normal appearing cervix with IUD strings visualized at the os. Uterus is normal in size. No adnexal mass or tenderness. Chaperone present during the pelvic exam EXTREMITIES: No cyanosis, clubbing, or edema, 2+ distal pulses.  US PELVIC COMPLETE WITH TRANSVAGINAL  Result Date: 02/13/2022 CLINICAL DATA:  Abnormal uterine bleeding since 2021, unknown LMP EXAM: TRANSABDOMINAL AND TRANSVAGINAL ULTRASOUND OF PELVIS TECHNIQUE: Both transabdominal and transvaginal ultrasound examinations of the pelvis were  performed. Transabdominal technique was performed for global imaging of the pelvis including uterus, ovaries, adnexal regions, and pelvic cul-de-sac. It was necessary to proceed with endovaginal exam following the transabdominal exam to visualize the uterus, endometrium, and ovaries. COMPARISON:  07/04/2019 FINDINGS: Uterus Measurements: 17.8 x 9.5 x 10.1 cm = volume: 88 mL. Anteverted. Enlarged, heterogeneous, and nodular containing multiple leiomyomata throughout. Largest measured lesions include 9.1 cm fundal exophytic leiomyoma to RIGHT, 6.8 cm LEFT fundal leiomyoma, and 3.8 cm posterior fundal leiomyoma. Observed leiomyomata range from subserosal to submucosal and obscure the endometrium. Endometrium Obscured by multiple leiomyomata Right ovary Not visualized, likely obscured by bowel Left ovary Not visualized, likely obscured by bowel Other findings Small amount of free pelvic fluid.  No adnexal masses. IMPRESSION: Significantly enlarged and nodular uterus containing multiple uterine leiomyomata up to 9.1 cm diameter. Nonvisualization of endometrial complex and ovaries. Electronically Signed   By: Lavonia Dana M.D.   On: 02/13/2022 16:58     A/P 42 yo with fibroid uterus and AUB here for endometrial biopsy - Ultrasound report reviewed with the patient - Benefits of endometrial biopsy reviewed ENDOMETRIAL BIOPSY     The indications for endometrial biopsy were reviewed.   Risks of the biopsy including cramping, bleeding, infection, uterine perforation, inadequate specimen and need for additional procedures  were discussed. The patient states she understands and agrees to undergo procedure today. Consent was signed. Time out was performed. Urine HCG was negative. A sterile speculum was placed in the patient's vagina and the cervix was  prepped with Betadine. A single-toothed tenaculum was placed on the anterior lip of the cervix to stabilize it. The uterine cavity was sounded to a depth of 14 cm using the  uterine sound. The 3 mm pipelle was introduced into the endometrial cavity without difficulty, 2 passes were made.  A  moderate amount of tissue was  sent to pathology. The instruments were removed from the patient's vagina. Minimal bleeding from the cervix was noted. The patient tolerated the procedure well.  Routine post-procedure instructions were given to the patient. The patient will follow up in two weeks to review the results and for further management.   - Patient will be contacted with results - Discussed management options of AUB related to fibroid uterus pending results of endometrial biopsy. Patient desires to preserve her fertility and have at least one child. She desires to have a myomectomy. Risks, benefits and alternatives were explained including but not limited to risks of bleeding, infection and damage to adjacent organs. Patient verbalized understanding and all questions were answered - Mammogram ordered

## 2022-03-10 LAB — SURGICAL PATHOLOGY

## 2022-03-13 HISTORY — PX: UMBILICAL HERNIA REPAIR: SHX196

## 2022-04-28 ENCOUNTER — Ambulatory Visit
Admission: RE | Admit: 2022-04-28 | Discharge: 2022-04-28 | Disposition: A | Discharge status: Home / Self Care | Payer: Medicare HMO | Source: Ambulatory Visit | Attending: Obstetrics and Gynecology | Admitting: Obstetrics and Gynecology

## 2022-04-28 DIAGNOSIS — Z1231 Encounter for screening mammogram for malignant neoplasm of breast: Secondary | ICD-10-CM

## 2022-04-30 ENCOUNTER — Other Ambulatory Visit: Payer: Self-pay | Admitting: Obstetrics and Gynecology

## 2022-04-30 DIAGNOSIS — R928 Other abnormal and inconclusive findings on diagnostic imaging of breast: Secondary | ICD-10-CM

## 2022-05-04 NOTE — Progress Notes (Signed)
Surgical Instructions    Your procedure is scheduled on Tuesday April 30th.  Report to [facility] Entr[facility] "A" at 5:30 A.M., then check in with the Admitting office.  Call this number if you have problems the morning of surgery:  [phone]   If you have any questions prior to your surgery date call [phone]: Open Monday-Friday 8am-4pm If you experience any cold or flu symptoms such as cough, fever, chills, shortness of breath, etc. between now and your scheduled surgery, please notify us at the above number     Remember:  Do not eat or drink after midnight the night before your surgery     Take these medicines the morning of surgery with A SIP OF WATER: methocarbamol (ROBAXIN) 500 MG tablet  linaclotide (LINZESS) 145 MCG CAPS capsule  sertraline (ZOLOFT) 25 MG tablet   IF NEEDED acetaminophen (TYLENOL) 325 MG tablet  gabapentin (NEURONTIN) 300 MG capsule  oxybutynin (DITROPAN-XL) 10 MG 24 hr tablet    As of today, STOP taking any Aspirin (unless otherwise instructed by your surgeon) Aleve, Naproxen, Ibuprofen, Motrin, Advil, Goody's, BC's, all herbal medications, fish oil, and all vitamins.           Do not wear jewelry or makeup. Do not wear lotions, powders, perfumes or deodorant. Do not shave 48 hours prior to surgery.   Do not bring valuables to the hospital. Do not wear nail polish, gel polish, artificial nails, or any other type of covering on natural nails (fingers and toes) If you have artificial nails or gel coating that need to be removed by a nail salon, please have this removed prior to surgery. Artificial nails or gel coating may interfere with anesthesia's ability to adequately monitor your vital signs.  [facility] is not responsible for any belongings or valuables.    Do NOT Smoke (Tobacco/Vaping)  24 hours prior to your procedure  If you use a CPAP at night, you may bring your mask for your overnight stay.   Contacts, glasses, hearing aids,  dentures or partials may not be worn into surgery, please bring cases for these belongings   For patients admitted to the hospital, discharge time will be determined by your treatment team.   Patients discharged the day of surgery will not be allowed to drive home, and someone needs to stay with them for 24 hours.   SURGICAL WAITING ROOM VISITATION Patients having surgery or a procedure may have no more than 2 support people in the waiting area - these visitors may rotate.   Children under the age of 74 must have an adult with them who is not the patient. If the patient needs to stay at the hospital during part of their recovery, the visitor guidelines for inpatient rooms apply. Pre-op nurse will coordinate an appropriate time for 1 support person to accompany patient in pre-op.  This support person may not rotate.   Please refer to [email or web address] for the visitor guidelines for Inpatients (after your surgery is over and you are in a regular room).    Special instructions:    Oral Hygiene is also important to reduce your risk of infection.  Remember - BRUSH YOUR TEETH THE MORNING OF SURGERY WITH YOUR REGULAR TOOTHPASTE   - Preparing For Surgery  Before surgery, you can play an important role. Because skin is not sterile, your skin needs to be as free of germs as possible. You can reduce the number of germs on your skin by washing with  CHG (chlorahexidine gluconate) Soap before surgery.  CHG is an antiseptic cleaner which kills germs and bonds with the skin to continue killing germs even after washing.     Please do not use if you have an allergy to CHG or antibacterial soaps. If your skin becomes reddened/irritated stop using the CHG.  Do not shave (including legs and underarms) for at least 48 hours prior to first CHG shower. It is OK to shave your face.  Please follow these instructions carefully.     Shower the NIGHT  BEFORE SURGERY and the MORNING OF SURGERY with CHG Soap.   If you chose to wash your hair, wash your hair first as usual with your normal shampoo. After you shampoo, rinse your hair and body thoroughly to remove the shampoo.  Then [employer] and genitals (private parts) with your normal soap and rinse thoroughly to remove soap.  After that Use CHG Soap as you would any other liquid soap. You can apply CHG directly to the skin and wash gently with a scrungie or a clean washcloth.   Apply the CHG Soap to your body ONLY FROM THE NECK DOWN.  Do not use on open wounds or open sores. Avoid contact with your eyes, ears, mouth and genitals (private parts). Wash Face and genitals (private parts)  with your normal soap.   Wash thoroughly, paying special attention to the area where your surgery will be performed.  Thoroughly rinse your body with warm water from the neck down.  DO NOT shower/wash with your normal soap after using and rinsing off the CHG Soap.  Pat yourself dry with a CLEAN TOWEL.  Wear CLEAN PAJAMAS to bed the night before surgery  Place CLEAN SHEETS on your bed the night before your surgery  DO NOT SLEEP WITH PETS.   Day of Surgery:  Take a shower with CHG soap. Wear Clean/Comfortable clothing the morning of surgery Do not apply any deodorants/lotions.   Remember to brush your teeth WITH YOUR REGULAR TOOTHPASTE.    If you received a COVID test during your pre-op visit, it is requested that you wear a mask when out in public, stay away from anyone that may not be feeling well, and notify your surgeon if you develop symptoms. If you have been in contact with anyone that has tested positive in the last 10 days, please notify your surgeon.    Please read over the following fact sheets that you were given.

## 2022-05-05 ENCOUNTER — Other Ambulatory Visit: Payer: Self-pay

## 2022-05-05 ENCOUNTER — Encounter (HOSPITAL_COMMUNITY): Payer: Self-pay

## 2022-05-05 ENCOUNTER — Encounter (HOSPITAL_COMMUNITY)
Admission: RE | Admit: 2022-05-05 | Discharge: 2022-05-05 | Disposition: A | Discharge status: Home / Self Care | Payer: Medicare HMO | Source: Ambulatory Visit | Attending: Obstetrics and Gynecology | Admitting: Obstetrics and Gynecology

## 2022-05-05 DIAGNOSIS — Z01812 Encounter for preprocedural laboratory examination: Secondary | ICD-10-CM | POA: Diagnosis present

## 2022-05-05 DIAGNOSIS — N939 Abnormal uterine and vaginal bleeding, unspecified: Secondary | ICD-10-CM | POA: Insufficient documentation

## 2022-05-05 LAB — CBC
HCT: 35.6 % — ABNORMAL LOW (ref 36.0–46.0)
Hemoglobin: 10.9 g/dL — ABNORMAL LOW (ref 12.0–15.0)
MCH: 26 pg (ref 26.0–34.0)
MCHC: 30.6 g/dL (ref 30.0–36.0)
MCV: 84.8 fL (ref 80.0–100.0)
Platelets: 356 10*3/uL (ref 150–400)
RBC: 4.2 MIL/uL (ref 3.87–5.11)
RDW: 14 % (ref 11.5–15.5)
WBC: 6.7 10*3/uL (ref 4.0–10.5)
nRBC: 0 % (ref 0.0–0.2)

## 2022-05-05 NOTE — Progress Notes (Signed)
PCP - [address] - Denies  PPM/ICD - Denies-   Chest x-ray - n/I EKG - n/i Stress Test - Denies ECHO - Denies Cardiac Cath - Denies  Sleep Study - Denies  DM - Denies    Blood Thinner Instructions: N/A Aspirin Instructions:N/A  COVID TEST- N/A  Patient denies any respiratory virus or illness in the past two months.   Anesthesia review: No  Patient denies shortness of breath, fever, cough and chest pain at PAT appointment   All instructions explained to the patient, with a verbal understanding of the material. Patient agrees to go over the instructions while at home for a better understanding. The opportunity to ask questions was provided.

## 2022-05-11 NOTE — H&P (Signed)
Connie Malone is an 42 y.o. female P0 with BMI 41 with symptomatic fibroid uterus here for scheduled myomectomy. Patient with history of AUB despite Mirena IUD in place since 09/2021. She describes a 10-day period heavy in flow with passage of clots along with intermenstrual spotting. She is sexually active and desires to conceive a child. She is without any other complaints and denies pelvic pain or pressure.   Pertinent Gynecological History: Last mammogram: needs to be repeated Date: 04/2022 Last pap: normal Date: 01/2021 OB History: G0, P0   Menstrual History: Patient's last menstrual period was 04/21/2022 (approximate).    Past Medical History:  Diagnosis Date   Anemia    Bladder mass    Blood transfusion without reported diagnosis    Crohn disease (HCC)    Intestinal obstruction (HCC)    Iron deficiency anemia    Microcytic anemia     Past Surgical History:  Procedure Laterality Date   bladder mass  2021   bladder mass at [facility]   CYSTOSCOPY     UMBILICAL HERNIA REPAIR  03/2022   At [facility] History  Problem Relation Age of Onset   Parkinson's disease Mother    Heart Problems Father     Social History:  reports that she has been smoking cigarettes. She has never used smokeless tobacco. She reports current alcohol use. She reports that she does not use drugs.  Allergies:  Allergies  Allergen Reactions   Latex Rash    No medications prior to admission.    Review of Systems See pertinent in HPI. All other systems reviewed and non contributory Last menstrual period 04/21/2022. Physical Exam GENERAL: Well-developed, well-nourished female in no acute distress.  LUNGS: Clear to auscultation bilaterally.  HEART: Regular rate and rhythm. ABDOMEN: Soft, nontender, nondistended. No organomegaly. PELVIC: Deferred to OR EXTREMITIES: No cyanosis, clubbing, or edema, 2+ distal pulses.  No results found for this or any previous visit (from the past 24  hour(s)).  No results found. MM 3D SCREEN BREAST BILATERAL  Result Date: 04/29/2022 CLINICAL DATA:  Screening. EXAM: DIGITAL SCREENING BILATERAL MAMMOGRAM WITH TOMOSYNTHESIS AND CAD TECHNIQUE: Bilateral screening digital craniocaudal and mediolateral oblique mammograms were obtained. Bilateral screening digital breast tomosynthesis was performed. The images were evaluated with computer-aided detection. COMPARISON:  None available. ACR Breast Density Category b: There are scattered areas of fibroglandular density. FINDINGS: In the right breast possible masses require further evaluation. In the left breast a possible mass requires further evaluation. IMPRESSION: Further evaluation is suggested for possible masses in the right breast. Further evaluation is suggested for a possible mass in the left breast. RECOMMENDATION: Diagnostic mammogram and possibly ultrasound of both breasts. (Code:[id number]) The patient will be contacted regarding the findings, and additional imaging will be scheduled. BI-RADS CATEGORY  0: Incomplete: Need additional imaging evaluation. Electronically Signed   By: Frederico Hamman M.D.   On: 04/29/2022 13:46   US PELVIC COMPLETE WITH TRANSVAGINAL  Result Date: 02/13/2022 CLINICAL DATA:  Abnormal uterine bleeding since 2021, unknown LMP EXAM: TRANSABDOMINAL AND TRANSVAGINAL ULTRASOUND OF PELVIS TECHNIQUE: Both transabdominal and transvaginal ultrasound examinations of the pelvis were performed. Transabdominal technique was performed for global imaging of the pelvis including uterus, ovaries, adnexal regions, and pelvic cul-de-sac. It was necessary to proceed with endovaginal exam following the transabdominal exam to visualize the uterus, endometrium, and ovaries. COMPARISON:  07/04/2019 FINDINGS: Uterus Measurements: 17.8 x 9.5 x 10.1 cm = volume: 88 mL. Anteverted. Enlarged, heterogeneous, and nodular containing  multiple leiomyomata throughout. Largest measured lesions include 9.1 cm  fundal exophytic leiomyoma to RIGHT, 6.8 cm LEFT fundal leiomyoma, and 3.8 cm posterior fundal leiomyoma. Observed leiomyomata range from subserosal to submucosal and obscure the endometrium. Endometrium Obscured by multiple leiomyomata Right ovary Not visualized, likely obscured by bowel Left ovary Not visualized, likely obscured by bowel Other findings Small amount of free pelvic fluid.  No adnexal masses. IMPRESSION: Significantly enlarged and nodular uterus containing multiple uterine leiomyomata up to 9.1 cm diameter. Nonvisualization of endometrial complex and ovaries. Electronically Signed   By: Ulyses Southward M.D.   On: 02/13/2022 16:58     02/2022 Endometrial biopsy  A. ENDOMETRIUM, BIOPSY:  Fragmented benign endometrium with mixed hormone effect  Extensive stromal breakdown system with bleeding  Negative for polyp, atypia, hyperplasia and carcinoma   Assessment/Plan: 42 yo P0 with fibroid uterus and AUB here for scheduled myomectomy - Risks, benefits and alternatives were explained including but not limited to risks of bleeding, infection and damage to adjacent organs. Patient verbalized understanding and all questions were answered  Kaevon Cotta 05/11/2022, 8:25 AM

## 2022-05-12 ENCOUNTER — Encounter (HOSPITAL_COMMUNITY)
Admission: RE | Disposition: A | Discharge status: Home / Self Care | Payer: Self-pay | Source: Home / Self Care | Attending: Obstetrics and Gynecology

## 2022-05-12 ENCOUNTER — Inpatient Hospital Stay (HOSPITAL_COMMUNITY): Payer: Medicare HMO | Admitting: Anesthesiology

## 2022-05-12 ENCOUNTER — Inpatient Hospital Stay (HOSPITAL_COMMUNITY)
Admission: RE | Admit: 2022-05-12 | Discharge: 2022-05-14 | DRG: 742 | Disposition: A | Discharge status: Home / Self Care | Payer: Medicare HMO | Attending: Obstetrics and Gynecology | Admitting: Obstetrics and Gynecology

## 2022-05-12 ENCOUNTER — Encounter (HOSPITAL_COMMUNITY): Payer: Self-pay | Admitting: Obstetrics and Gynecology

## 2022-05-12 ENCOUNTER — Other Ambulatory Visit: Payer: Self-pay

## 2022-05-12 DIAGNOSIS — Z91040 Latex allergy status: Secondary | ICD-10-CM

## 2022-05-12 DIAGNOSIS — D259 Leiomyoma of uterus, unspecified: Secondary | ICD-10-CM | POA: Diagnosis present

## 2022-05-12 DIAGNOSIS — D252 Subserosal leiomyoma of uterus: Secondary | ICD-10-CM | POA: Diagnosis present

## 2022-05-12 DIAGNOSIS — D62 Acute posthemorrhagic anemia: Secondary | ICD-10-CM | POA: Diagnosis present

## 2022-05-12 DIAGNOSIS — N939 Abnormal uterine and vaginal bleeding, unspecified: Secondary | ICD-10-CM | POA: Diagnosis present

## 2022-05-12 DIAGNOSIS — F17210 Nicotine dependence, cigarettes, uncomplicated: Secondary | ICD-10-CM | POA: Diagnosis present

## 2022-05-12 DIAGNOSIS — D250 Submucous leiomyoma of uterus: Secondary | ICD-10-CM

## 2022-05-12 DIAGNOSIS — D251 Intramural leiomyoma of uterus: Principal | ICD-10-CM | POA: Diagnosis present

## 2022-05-12 DIAGNOSIS — L299 Pruritus, unspecified: Secondary | ICD-10-CM | POA: Diagnosis present

## 2022-05-12 DIAGNOSIS — Z6841 Body Mass Index (BMI) 40.0 and over, adult: Secondary | ICD-10-CM

## 2022-05-12 DIAGNOSIS — Z9889 Other specified postprocedural states: Principal | ICD-10-CM

## 2022-05-12 HISTORY — PX: MYOMECTOMY: SHX85

## 2022-05-12 LAB — POCT I-STAT EG7
Acid-base deficit: 1 mmol/L (ref 0.0–2.0)
Bicarbonate: 24.8 mmol/L (ref 20.0–28.0)
Calcium, Ion: 1.21 mmol/L (ref 1.15–1.40)
HCT: 26 % — ABNORMAL LOW (ref 36.0–46.0)
Hemoglobin: 8.8 g/dL — ABNORMAL LOW (ref 12.0–15.0)
O2 Saturation: 95 %
Potassium: 5 mmol/L (ref 3.5–5.1)
Sodium: 136 mmol/L (ref 135–145)
TCO2: 26 mmol/L (ref 22–32)
pCO2, Ven: 43.4 mmHg — ABNORMAL LOW (ref 44–60)
pH, Ven: 7.365 (ref 7.25–7.43)
pO2, Ven: 78 mmHg — ABNORMAL HIGH (ref 32–45)

## 2022-05-12 LAB — TYPE AND SCREEN: Unit division: 0

## 2022-05-12 LAB — BPAM RBC
Blood Product Expiration Date: 202405212359
Blood Product Expiration Date: 202405212359
ISSUE DATE / TIME: 202404301135
ISSUE DATE / TIME: 202404301630
Unit Type and Rh: 7300

## 2022-05-12 LAB — POCT PREGNANCY, URINE: Preg Test, Ur: NEGATIVE

## 2022-05-12 LAB — PREPARE RBC (CROSSMATCH)

## 2022-05-12 SURGERY — MYOMECTOMY, ABDOMINAL APPROACH
Anesthesia: General | Site: Abdomen

## 2022-05-12 MED ORDER — LACTATED RINGERS IV SOLN: INTRAVENOUS | Status: DC | PRN

## 2022-05-12 MED ORDER — LACTATED RINGERS IV SOLN: INTRAVENOUS | Status: DC

## 2022-05-12 MED ORDER — OXYCODONE HCL 5 MG/5ML PO SOLN: 5.0000 mg | Freq: Once | ORAL | Status: DC | PRN

## 2022-05-12 MED ORDER — HYDROMORPHONE 1 MG/ML IV SOLN
INTRAVENOUS | Status: AC
  Filled 2022-05-12: qty 30

## 2022-05-12 MED ORDER — 0.9 % SODIUM CHLORIDE (POUR BTL) OPTIME
TOPICAL | Status: DC | PRN
  Administered 2022-05-12: 1000 mL

## 2022-05-12 MED ORDER — HYDROMORPHONE 1 MG/ML IV SOLN
INTRAVENOUS | Status: DC
  Administered 2022-05-12: 30 mg via INTRAVENOUS
  Administered 2022-05-12: 3.6 mg via INTRAVENOUS
  Administered 2022-05-12: 1.2 mg via INTRAVENOUS
  Administered 2022-05-12: 0.6 mg via INTRAVENOUS
  Administered 2022-05-13: 2.4 mg via INTRAVENOUS
  Administered 2022-05-13: 5.4 mL via INTRAVENOUS

## 2022-05-12 MED ORDER — FENTANYL CITRATE (PF) 250 MCG/5ML IJ SOLN
INTRAMUSCULAR | Status: DC | PRN
  Administered 2022-05-12: 50 ug via INTRAVENOUS
  Administered 2022-05-12: 25 ug via INTRAVENOUS
  Administered 2022-05-12 (×3): 50 ug via INTRAVENOUS
  Administered 2022-05-12: 25 ug via INTRAVENOUS

## 2022-05-12 MED ORDER — PROPOFOL 10 MG/ML IV BOLUS
INTRAVENOUS | Status: DC | PRN
  Administered 2022-05-12: 200 mg via INTRAVENOUS

## 2022-05-12 MED ORDER — CEFAZOLIN SODIUM-DEXTROSE 2-4 GM/100ML-% IV SOLN
2.0000 g | INTRAVENOUS | Status: AC
  Administered 2022-05-12 (×2): 2 g via INTRAVENOUS
  Filled 2022-05-12: qty 100

## 2022-05-12 MED ORDER — SERTRALINE HCL 50 MG PO TABS
25.0000 mg | ORAL_TABLET | Freq: Every day | ORAL | Status: DC
  Administered 2022-05-12 – 2022-05-14 (×3): 25 mg via ORAL
  Filled 2022-05-12 (×3): qty 1

## 2022-05-12 MED ORDER — SUGAMMADEX SODIUM 200 MG/2ML IV SOLN
INTRAVENOUS | Status: DC | PRN
  Administered 2022-05-12: 226.8 mg via INTRAVENOUS

## 2022-05-12 MED ORDER — HYDROMORPHONE HCL 1 MG/ML IJ SOLN
INTRAMUSCULAR | Status: AC
  Filled 2022-05-12: qty 1

## 2022-05-12 MED ORDER — ROCURONIUM BROMIDE 10 MG/ML (PF) SYRINGE
PREFILLED_SYRINGE | INTRAVENOUS | Status: DC | PRN
  Administered 2022-05-12 (×5): 10 mg via INTRAVENOUS
  Administered 2022-05-12: 70 mg via INTRAVENOUS
  Administered 2022-05-12: 10 mg via INTRAVENOUS

## 2022-05-12 MED ORDER — ONDANSETRON HCL 4 MG/2ML IJ SOLN
INTRAMUSCULAR | Status: DC | PRN
  Administered 2022-05-12: 4 mg via INTRAVENOUS

## 2022-05-12 MED ORDER — IBUPROFEN 600 MG PO TABS
600.0000 mg | ORAL_TABLET | Freq: Four times a day (QID) | ORAL | Status: DC | PRN
  Administered 2022-05-13: 600 mg via ORAL
  Filled 2022-05-12: qty 1

## 2022-05-12 MED ORDER — ENOXAPARIN SODIUM 40 MG/0.4ML IJ SOSY
40.0000 mg | PREFILLED_SYRINGE | INTRAMUSCULAR | Status: DC
  Administered 2022-05-13 – 2022-05-14 (×2): 40 mg via SUBCUTANEOUS
  Filled 2022-05-12 (×2): qty 0.4

## 2022-05-12 MED ORDER — HYDROMORPHONE HCL 1 MG/ML IJ SOLN
0.2500 mg | INTRAMUSCULAR | Status: DC | PRN
  Administered 2022-05-12 (×2): 0.5 mg via INTRAVENOUS

## 2022-05-12 MED ORDER — STERILE WATER FOR IRRIGATION IR SOLN
Status: DC | PRN
  Administered 2022-05-12: 1000 mL

## 2022-05-12 MED ORDER — GLYCOPYRROLATE PF 0.2 MG/ML IJ SOSY
PREFILLED_SYRINGE | INTRAMUSCULAR | Status: AC
  Filled 2022-05-12: qty 1

## 2022-05-12 MED ORDER — ROCURONIUM BROMIDE 10 MG/ML (PF) SYRINGE
PREFILLED_SYRINGE | INTRAVENOUS | Status: AC
  Filled 2022-05-12: qty 10

## 2022-05-12 MED ORDER — DIPHENHYDRAMINE HCL 12.5 MG/5ML PO ELIX
12.5000 mg | ORAL_SOLUTION | Freq: Four times a day (QID) | ORAL | Status: DC | PRN
  Administered 2022-05-13 (×2): 12.5 mg via ORAL
  Filled 2022-05-12 (×3): qty 5

## 2022-05-12 MED ORDER — BUPIVACAINE HCL (PF) 0.5 % IJ SOLN: INTRAMUSCULAR | Status: DC | PRN

## 2022-05-12 MED ORDER — ROPIVACAINE HCL 5 MG/ML IJ SOLN
INTRAMUSCULAR | Status: DC | PRN
  Administered 2022-05-12: 30 mL

## 2022-05-12 MED ORDER — OXYCODONE HCL 5 MG PO TABS: 5.0000 mg | ORAL_TABLET | Freq: Once | ORAL | Status: DC | PRN

## 2022-05-12 MED ORDER — ONDANSETRON HCL 4 MG/2ML IJ SOLN: 4.0000 mg | Freq: Once | INTRAMUSCULAR | Status: DC | PRN

## 2022-05-12 MED ORDER — GLYCOPYRROLATE PF 0.2 MG/ML IJ SOSY
PREFILLED_SYRINGE | INTRAMUSCULAR | Status: DC | PRN
  Administered 2022-05-12: .2 mg via INTRAVENOUS

## 2022-05-12 MED ORDER — ALBUMIN HUMAN 5 % IV SOLN: INTRAVENOUS | Status: DC | PRN

## 2022-05-12 MED ORDER — SODIUM CHLORIDE (PF) 0.9 % IJ SOLN
INTRAMUSCULAR | Status: AC
  Filled 2022-05-12: qty 100

## 2022-05-12 MED ORDER — CEFAZOLIN SODIUM 1 G IJ SOLR
INTRAMUSCULAR | Status: AC
  Filled 2022-05-12: qty 20

## 2022-05-12 MED ORDER — SODIUM CHLORIDE 0.9% FLUSH: 9.0000 mL | INTRAVENOUS | Status: DC | PRN

## 2022-05-12 MED ORDER — POVIDONE-IODINE 10 % EX SWAB: 2.0000 | Freq: Once | CUTANEOUS | Status: DC

## 2022-05-12 MED ORDER — KETOROLAC TROMETHAMINE 30 MG/ML IJ SOLN: 30.0000 mg | Freq: Once | INTRAMUSCULAR | Status: DC | PRN

## 2022-05-12 MED ORDER — DIPHENHYDRAMINE HCL 25 MG PO CAPS
25.0000 mg | ORAL_CAPSULE | Freq: Once | ORAL | Status: AC
  Administered 2022-05-12: 25 mg via ORAL
  Filled 2022-05-12 (×2): qty 1

## 2022-05-12 MED ORDER — BISACODYL 5 MG PO TBEC: 5.0000 mg | DELAYED_RELEASE_TABLET | Freq: Every day | ORAL | Status: DC | PRN

## 2022-05-12 MED ORDER — VASOPRESSIN 20 UNIT/ML IV SOLN
INTRAVENOUS | Status: DC | PRN
  Administered 2022-05-12: 64 mL via INTRAMUSCULAR

## 2022-05-12 MED ORDER — CHLORHEXIDINE GLUCONATE 0.12 % MT SOLN: 15.0000 mL | OROMUCOSAL | Status: AC

## 2022-05-12 MED ORDER — DIPHENHYDRAMINE HCL 50 MG/ML IJ SOLN
12.5000 mg | Freq: Four times a day (QID) | INTRAMUSCULAR | Status: DC | PRN
  Administered 2022-05-12: 12.5 mg via INTRAVENOUS
  Filled 2022-05-12: qty 1

## 2022-05-12 MED ORDER — KETAMINE HCL 10 MG/ML IJ SOLN
INTRAMUSCULAR | Status: DC | PRN
  Administered 2022-05-12: 30 mg via INTRAVENOUS

## 2022-05-12 MED ORDER — PRENATAL MULTIVITAMIN CH
1.0000 | ORAL_TABLET | Freq: Every day | ORAL | Status: DC
  Administered 2022-05-13 – 2022-05-14 (×2): 1 via ORAL
  Filled 2022-05-12 (×2): qty 1

## 2022-05-12 MED ORDER — VASOPRESSIN 20 UNIT/ML IV SOLN
INTRAVENOUS | Status: AC
  Filled 2022-05-12: qty 1

## 2022-05-12 MED ORDER — ONDANSETRON HCL 4 MG/2ML IJ SOLN: 4.0000 mg | Freq: Four times a day (QID) | INTRAMUSCULAR | Status: DC | PRN

## 2022-05-12 MED ORDER — FENTANYL CITRATE (PF) 250 MCG/5ML IJ SOLN
INTRAMUSCULAR | Status: AC
  Filled 2022-05-12: qty 5

## 2022-05-12 MED ORDER — MIDAZOLAM HCL 5 MG/5ML IJ SOLN
INTRAMUSCULAR | Status: DC | PRN
  Administered 2022-05-12: 2 mg via INTRAVENOUS

## 2022-05-12 MED ORDER — DEXAMETHASONE SODIUM PHOSPHATE 10 MG/ML IJ SOLN
INTRAMUSCULAR | Status: DC | PRN
  Administered 2022-05-12: 10 mg via INTRAVENOUS

## 2022-05-12 MED ORDER — MIDAZOLAM HCL 2 MG/2ML IJ SOLN
INTRAMUSCULAR | Status: AC
  Filled 2022-05-12: qty 2

## 2022-05-12 MED ORDER — NALOXONE HCL 0.4 MG/ML IJ SOLN: 0.4000 mg | INTRAMUSCULAR | Status: DC | PRN

## 2022-05-12 MED ORDER — BUPIVACAINE HCL (PF) 0.5 % IJ SOLN
INTRAMUSCULAR | Status: AC
  Filled 2022-05-12: qty 30

## 2022-05-12 MED ORDER — PROPOFOL 10 MG/ML IV BOLUS
INTRAVENOUS | Status: AC
  Filled 2022-05-12: qty 20

## 2022-05-12 MED ORDER — LIDOCAINE 2% (20 MG/ML) 5 ML SYRINGE
INTRAMUSCULAR | Status: DC | PRN
  Administered 2022-05-12: 100 mg via INTRAVENOUS

## 2022-05-12 MED ORDER — KETAMINE HCL 50 MG/5ML IJ SOSY
PREFILLED_SYRINGE | INTRAMUSCULAR | Status: AC
  Filled 2022-05-12: qty 5

## 2022-05-12 MED ORDER — ACETAMINOPHEN 325 MG PO TABS
650.0000 mg | ORAL_TABLET | Freq: Once | ORAL | Status: AC
  Administered 2022-05-12: 650 mg via ORAL
  Filled 2022-05-12 (×2): qty 2

## 2022-05-12 MED ORDER — CHLORHEXIDINE GLUCONATE 0.12 % MT SOLN
OROMUCOSAL | Status: AC
  Administered 2022-05-12: 15 mL
  Filled 2022-05-12: qty 15

## 2022-05-12 MED ORDER — SODIUM CHLORIDE 0.9% IV SOLUTION: Freq: Once | INTRAVENOUS | Status: DC

## 2022-05-12 SURGICAL SUPPLY — 46 items
ADH SKN CLS APL DERMABOND .7 (GAUZE/BANDAGES/DRESSINGS) ×1
BARRIER ADHS 3X4 INTERCEED (GAUZE/BANDAGES/DRESSINGS) IMPLANT
BRR ADH 4X3 ABS CNTRL BYND (GAUZE/BANDAGES/DRESSINGS) ×1
CANISTER SUCT 3000ML PPV (MISCELLANEOUS) ×2 IMPLANT
DERMABOND ADVANCED .7 DNX12 (GAUZE/BANDAGES/DRESSINGS) IMPLANT
DRAIN PENROSE 0.5X18 (DRAIN) IMPLANT
DRAPE CESAREAN BIRTH W POUCH (DRAPES) ×2 IMPLANT
DRSG OPSITE POSTOP 4X10 (GAUZE/BANDAGES/DRESSINGS) ×2 IMPLANT
DURAPREP 26ML APPLICATOR (WOUND CARE) ×2 IMPLANT
GAUZE 4X4 16PLY ~~LOC~~+RFID DBL (SPONGE) IMPLANT
GAUZE PAD ABD 8X10 STRL (GAUZE/BANDAGES/DRESSINGS) IMPLANT
GLOVE BIOGEL PI IND STRL 6.5 (GLOVE) ×2 IMPLANT
GLOVE BIOGEL PI IND STRL 7.0 (GLOVE) ×2 IMPLANT
GLOVE SURG SS PI 6.5 STRL IVOR (GLOVE) ×2 IMPLANT
GOWN STRL REUS W/ TWL LRG LVL3 (GOWN DISPOSABLE) ×6 IMPLANT
GOWN STRL REUS W/TWL LRG LVL3 (GOWN DISPOSABLE) ×3
KIT TURNOVER KIT B (KITS) ×2 IMPLANT
MANIFOLD NEPTUNE II (INSTRUMENTS) IMPLANT
NDL HYPO 22X1.5 SAFETY MO (MISCELLANEOUS) ×4 IMPLANT
NEEDLE HYPO 22X1.5 SAFETY MO (MISCELLANEOUS) ×2 IMPLANT
NS IRRIG 1000ML POUR BTL (IV SOLUTION) ×2 IMPLANT
PACK ABDOMINAL GYN (CUSTOM PROCEDURE TRAY) ×2 IMPLANT
PAD ARMBOARD 7.5X6 YLW CONV (MISCELLANEOUS) ×2 IMPLANT
PAD OB MATERNITY 4.3X12.25 (PERSONAL CARE ITEMS) ×2 IMPLANT
PENCIL SMOKE EVACUATOR (MISCELLANEOUS) ×2 IMPLANT
SPIKE FLUID TRANSFER (MISCELLANEOUS) ×2 IMPLANT
SPONGE T-LAP 18X18 ~~LOC~~+RFID (SPONGE) IMPLANT
STAPLER VISISTAT 35W (STAPLE) ×2 IMPLANT
SUT PLAIN 2 0 XLH (SUTURE) IMPLANT
SUT VIC AB 0 CT1 18XCR BRD8 (SUTURE) IMPLANT
SUT VIC AB 0 CT1 27 (SUTURE) ×2
SUT VIC AB 0 CT1 27XBRD ANBCTR (SUTURE) ×4 IMPLANT
SUT VIC AB 0 CT1 36 (SUTURE) ×8 IMPLANT
SUT VIC AB 0 CT1 8-18 (SUTURE) ×4
SUT VIC AB 2-0 CT1 27 (SUTURE) ×1
SUT VIC AB 2-0 CT1 TAPERPNT 27 (SUTURE) ×2 IMPLANT
SUT VIC AB 2-0 SH 27 (SUTURE)
SUT VIC AB 2-0 SH 27XBRD (SUTURE) IMPLANT
SUT VIC AB 4-0 KS 27 (SUTURE) ×2 IMPLANT
SUT VIC AB 4-0 SH 27 (SUTURE)
SUT VIC AB 4-0 SH 27XANBCTRL (SUTURE) IMPLANT
SYR CONTROL 10ML LL (SYRINGE) ×4 IMPLANT
SYR TB 1ML LUER SLIP (SYRINGE) ×2 IMPLANT
TOWEL GREEN STERILE FF (TOWEL DISPOSABLE) ×4 IMPLANT
TRAY FOLEY W/BAG SLVR 14FR (SET/KITS/TRAYS/PACK) ×2 IMPLANT
WATER STERILE IRR 1000ML POUR (IV SOLUTION) IMPLANT

## 2022-05-12 NOTE — Anesthesia Procedure Notes (Signed)

## 2022-05-12 NOTE — Anesthesia Procedure Notes (Signed)
Anesthesia Procedure Image    

## 2022-05-12 NOTE — Transfer of Care (Signed)
Immediate Anesthesia Transfer of Care Note  Patient: Verdis Prime  Procedure(s) Performed: ABDOMINAL MYOMECTOMY (Abdomen)  Patient Location: PACU  Anesthesia Type:General  Level of Consciousness: awake, alert , and oriented  Airway & Oxygen Therapy: Patient Spontanous Breathing and Patient connected to nasal cannula oxygen  Post-op Assessment: Report given to RN, Post -op Vital signs reviewed and stable, Patient moving all extremities X 4, and Patient able to stick tongue midline  Post vital signs: Reviewed  Last Vitals:  Vitals Value Taken Time  BP 111/70 05/12/22 1100  Temp 97.6   Pulse 88 05/12/22 1102  Resp 13 05/12/22 1102  SpO2 100 % 05/12/22 1102  Vitals shown include unvalidated device data.  Last Pain:  Vitals:   05/12/22 0630  TempSrc:   PainSc: 3       Patients Stated Pain Goal: 0 (05/12/22 0630)  Complications: No notable events documented.

## 2022-05-12 NOTE — Op Note (Addendum)
Connie Malone PROCEDURE DATE: 05/12/2022  PREOPERATIVE DIAGNOSIS: Symptomatic uterine leiomyomata. POSTOPERATIVE DIAGNOSIS: The same PROCEDURE: Abdominal Myomectomy SURGEON:  Dr. Catalina Antigua ASSISTANT: Dr. Vergie Living  INDICATIONS: 42 y.o. G0P0000 here for abdominal myomectomy for symptomatic uterine leiomyomata.  Risks of surgery were discussed with the patient including but not limited to: bleeding which may require transfusion or reoperation; infection which may require antibiotics; injury to bowel, bladder, ureters or other surrounding organs; need for additional procedures; thromboembolic phenomenon, incisional problems and other postoperative/anesthesia complications. Written informed consent was obtained.    FINDINGS:  Uterine leiomyomata. 16 in number, ranging in size  1-10 cm.  ANESTHESIA:    General INTRAVENOUS FLUIDS:3000  ml ESTIMATED BLOOD LOSS:1500 ml URINE OUTPUT: 300 ml SPECIMENS: Leiomyomata sent to pathology COMPLICATIONS: None immediate  PROCEDURE IN DETAIL:  The patient received IV preoperative antibiotics approximately 30 minutes prior to procedure.  She was then taken to the operating room and general anesthesia was administered without difficulty.  .She was placed in dorsal supine position and prepped and draped in a sterile manner.  A Foley catheter was inserted into the bladder and attached to Loie Jahr drainage.  After an adequate timeout was performed, attention was then turned to the abdomen where a Pfannenstiel incision was made with a scalpel.  This incision was carried down to the fascia using electrocautery.  The fascia was incised in the midline and this incision was extended bilaterally using the Mayo scissors.  Kocher's were applied to the superior aspect of the fascial incision, and the rectus muscles were dissected off bluntly and sharply using the Mayo scissors.  A similar process was carried out at the inferior aspect of the incision.  The rectus muscles were  separated in the midline bluntly and the peritoneum was identified, picked up and incised with the scalpel.  This incision was extended superiorly and inferiorly using the scalpel with good visualization of the bladder and bowel.  Attention was then turned to the uterus where multiple uterine leiomyomata were noted.  The bowel were packed away with moist laparotomy sponges.  The uterus was then delivered up through the incision.  Attention was then turned to the uterine surface where the largest leiomyoma was identified. Vasopressin solution was injected over the surface of the leiomyoma to aid with hemostasis.   A vertical incision was made over the uterine leiomyoma into the leiomyoma, and the capsule was recognized.  Using blunt methods, the leiomyoma was freed from the surrounding myometrial tissue and removed intact.  Other leiomyomata were removed in similar fashion.  After removal of all the leiomyomata which were both on the anterior and posterior aspects of the uterus, the incisions were closed in layers, initiated by using 3-0 Vicryl in figure-of-eight stitches to close the breach into the endometrial cavity.  The deeper layers were also reapproximated using 0 Vicryl running stitches, and the serosa was reapproximated using 0 Vicryl imbricating stitch.  Overall good hemostasis was noted.   Overall, good hemostasis was noted.  The uterus was returned to the abdomen.  The laparotomy sponges were then removed from the abdomen, and an Interceed sheet was placed over the uterus as an adhesive barrier. Kelly clamps were placed on the peritoneum and the peritoneum was closed using 0 Vicryl running stitch.  The fascia was reapproximated with 0 Vicryl running stitch and the subcutaneous layer was reapproximated with 3-0 plain gut interrupted sutures.  The skin was closed with 3-0 Vicryl subcuticular stitch.  The patient tolerated the procedure well.  There were no complications during this case.  Sponge, lap, needle  and instrument counts were correct x2.  The patient was taken to the recovery room extubated and in stable condition.  An experienced assistant was required given the standard of surgical care given the complexity of the case.  This assistant was needed for exposure, dissection, suctioning, retraction, instrument exchange, and for overall help during the procedure.

## 2022-05-12 NOTE — Anesthesia Postprocedure Evaluation (Signed)
Anesthesia Post Note  Patient: Connie Malone  Procedure(s) Performed: ABDOMINAL MYOMECTOMY (Abdomen)     Patient location during evaluation: PACU Anesthesia Type: General Level of consciousness: awake and alert Pain management: pain level controlled Vital Signs Assessment: post-procedure vital signs reviewed and stable Respiratory status: spontaneous breathing, nonlabored ventilation, respiratory function stable and patient connected to nasal cannula oxygen Cardiovascular status: blood pressure returned to baseline and stable Postop Assessment: no apparent nausea or vomiting Anesthetic complications: no  No notable events documented.  Last Vitals:  Vitals:   05/12/22 1149 05/12/22 1200  BP: 132/81 130/86  Pulse: 68 (!) 58  Resp: 14 14  Temp: 36.4 C   SpO2: 100% 100%    Last Pain:  Vitals:   05/12/22 1149  TempSrc: Oral  PainSc:                  [name] S

## 2022-05-12 NOTE — Anesthesia Preprocedure Evaluation (Signed)
Anesthesia Evaluation  Patient identified by MRN, date of birth, ID band Patient awake    Reviewed: Allergy & Precautions, H&P , NPO status , Patient's Chart, lab work & pertinent test results  Airway Mallampati: II  TM Distance: >3 FB Neck ROM: Full    Dental no notable dental hx.    Pulmonary neg pulmonary ROS, Current Smoker   Pulmonary exam normal breath sounds clear to auscultation       Cardiovascular negative cardio ROS Normal cardiovascular exam Rhythm:Regular Rate:Normal     Neuro/Psych negative neurological ROS  negative psych ROS   GI/Hepatic negative GI ROS, Neg liver ROS,,,  Endo/Other    Morbid obesity  Renal/GU negative Renal ROS  negative genitourinary   Musculoskeletal negative musculoskeletal ROS (+)    Abdominal   Peds negative pediatric ROS (+)  Hematology negative hematology ROS (+)   Anesthesia Other Findings   Reproductive/Obstetrics negative OB ROS                             Anesthesia Physical Anesthesia Plan  ASA: 2  Anesthesia Plan: General   Post-op Pain Management: Regional block*   Induction: Intravenous  PONV Risk Score and Plan: 2 and Ondansetron, Dexamethasone and Treatment may vary due to age or medical condition  Airway Management Planned: Oral ETT  Additional Equipment:   Intra-op Plan:   Post-operative Plan: Extubation in OR  Informed Consent: I have reviewed the patients History and Physical, chart, labs and discussed the procedure including the risks, benefits and alternatives for the proposed anesthesia with the patient or authorized representative who has indicated his/her understanding and acceptance.     Dental advisory given  Plan Discussed with: CRNA and Surgeon  Anesthesia Plan Comments:        Anesthesia Quick Evaluation

## 2022-05-12 NOTE — Anesthesia Procedure Notes (Signed)
Anesthesia Regional Block: TAP block   Pre-Anesthetic Checklist: , timeout performed,  Correct Patient, Correct Site, Correct Laterality,  Correct Procedure, Correct Position, site marked,  Risks and benefits discussed,  Surgical consent,  Pre-op evaluation,  At surgeon's request and post-op pain management  Laterality: Right  Prep: chloraprep       Needles:  Injection technique: Single-shot  Needle Type: Echogenic Needle     Needle Length: 9cm      Additional Needles:   Procedures:,,,, ultrasound used (permanent image in chart),,    Narrative:  Start time: 05/12/2022 6:56 AM End time: 05/12/2022 7:04 AM Injection made incrementally with aspirations every 5 mL.  Performed by: Personally  Anesthesiologist: Eilene Ghazi, MD  Additional Notes: Patient tolerated the procedure well without complications

## 2022-05-13 ENCOUNTER — Other Ambulatory Visit: Payer: Self-pay

## 2022-05-13 ENCOUNTER — Encounter (HOSPITAL_COMMUNITY): Payer: Self-pay | Admitting: Obstetrics and Gynecology

## 2022-05-13 DIAGNOSIS — N939 Abnormal uterine and vaginal bleeding, unspecified: Secondary | ICD-10-CM | POA: Diagnosis not present

## 2022-05-13 DIAGNOSIS — D252 Subserosal leiomyoma of uterus: Secondary | ICD-10-CM | POA: Diagnosis not present

## 2022-05-13 DIAGNOSIS — D251 Intramural leiomyoma of uterus: Secondary | ICD-10-CM | POA: Diagnosis not present

## 2022-05-13 DIAGNOSIS — D250 Submucous leiomyoma of uterus: Secondary | ICD-10-CM | POA: Diagnosis not present

## 2022-05-13 LAB — CBC
HCT: 30.6 % — ABNORMAL LOW (ref 36.0–46.0)
Hemoglobin: 10.4 g/dL — ABNORMAL LOW (ref 12.0–15.0)
MCH: 28.3 pg (ref 26.0–34.0)
MCHC: 34 g/dL (ref 30.0–36.0)
MCV: 83.4 fL (ref 80.0–100.0)
Platelets: 260 10*3/uL (ref 150–400)
RBC: 3.67 MIL/uL — ABNORMAL LOW (ref 3.87–5.11)
RDW: 14 % (ref 11.5–15.5)
WBC: 7.2 10*3/uL (ref 4.0–10.5)
nRBC: 0 % (ref 0.0–0.2)

## 2022-05-13 LAB — BPAM RBC
Blood Product Expiration Date: 202405222359
ISSUE DATE / TIME: 202404302028
Unit Type and Rh: 7300
Unit Type and Rh: 7300

## 2022-05-13 LAB — TYPE AND SCREEN
ABO/RH(D): B POS
Antibody Screen: NEGATIVE
Unit division: 0
Unit division: 0

## 2022-05-13 LAB — CREATININE, SERUM
Creatinine, Ser: 0.66 mg/dL (ref 0.44–1.00)
GFR, Estimated: >60 mL/min (ref >60)

## 2022-05-13 LAB — SURGICAL PATHOLOGY

## 2022-05-13 MED ORDER — IBUPROFEN 600 MG PO TABS
600.0000 mg | ORAL_TABLET | Freq: Four times a day (QID) | ORAL | Status: DC
  Administered 2022-05-13 – 2022-05-14 (×5): 600 mg via ORAL
  Filled 2022-05-13 (×5): qty 1

## 2022-05-13 MED ORDER — OXYCODONE-ACETAMINOPHEN 5-325 MG PO TABS
1.0000 | ORAL_TABLET | Freq: Four times a day (QID) | ORAL | Status: DC | PRN
  Administered 2022-05-13 – 2022-05-14 (×4): 2 via ORAL
  Filled 2022-05-13 (×5): qty 2

## 2022-05-13 NOTE — Progress Notes (Signed)
1 Day Post-Op Procedure(s) (LRB): ABDOMINAL MYOMECTOMY (N/A)  Subjective: Patient reports feeling well with the exception of pruritus.  She tolerated a regular diet. She ambulated and is voiding. She feels abdominal soreness  Objective: I have reviewed patient's vital signs and labs. Blood pressure 112/71, pulse 70, temperature 97.6 F (36.4 C), temperature source Oral, resp. rate 15, height 5\' 6"  (1.676 m), weight 113.4 kg, last menstrual period 04/21/2022, SpO2 100 %.  GENERAL: Well-developed, well-nourished female in no acute distress.  LUNGS: Clear to auscultation bilaterally.  HEART: Regular rate and rhythm. ABDOMEN: Soft, appropriately tender, nondistended. No organomegaly. Incision: pressure dressing removed. Honeycomb dressing intact PELVIC: Not indicated EXTREMITIES: No cyanosis, clubbing, or edema, 2+ distal pulses.   Assessment: s/p Procedure(s): ABDOMINAL MYOMECTOMY (N/A): stable  Plan: Encourage ambulation Advance to PO medication Discontinue IV fluids Patient stable following 3 units pRBC for the management of anemia due to acute blood loss  LOS: 1 day    Catalina Antigua, MD 05/13/2022, 8:28 AM

## 2022-05-14 ENCOUNTER — Other Ambulatory Visit: Payer: Medicare HMO

## 2022-05-14 DIAGNOSIS — D251 Intramural leiomyoma of uterus: Secondary | ICD-10-CM | POA: Diagnosis not present

## 2022-05-14 DIAGNOSIS — N939 Abnormal uterine and vaginal bleeding, unspecified: Secondary | ICD-10-CM | POA: Diagnosis not present

## 2022-05-14 DIAGNOSIS — D252 Subserosal leiomyoma of uterus: Secondary | ICD-10-CM | POA: Diagnosis not present

## 2022-05-14 DIAGNOSIS — D250 Submucous leiomyoma of uterus: Secondary | ICD-10-CM | POA: Diagnosis not present

## 2022-05-14 MED ORDER — IBUPROFEN 600 MG PO TABS: 600.0000 mg | ORAL_TABLET | Freq: Four times a day (QID) | ORAL | 3 refills | Status: AC

## 2022-05-14 MED ORDER — OXYCODONE-ACETAMINOPHEN 5-325 MG PO TABS: 1.0000 | ORAL_TABLET | Freq: Four times a day (QID) | ORAL | 0 refills | Status: DC | PRN

## 2022-05-14 NOTE — Discharge Summary (Signed)
Physician Discharge Summary  Patient ID: Connie Malone MRN: [id number] DOB/AGE: Dec 29, 1980 42 y.o.  Admit date: 05/12/2022 Discharge date: 05/14/2022  Admission Diagnoses: Symptomatic fibroid uterus  Discharge Diagnoses:  Principal Problem:   S/P myomectomy Active Problems:   Intramural, submucous, and subserous leiomyoma of uterus Anemia due to acute blood loss  Discharged Condition: good  Hospital Course: patient admitted for scheduled abdominal myomectomy. She underwent an uncomplicated procedure where a total of 16 fibroids were removed. Due to acute blood loss anemia related to her surgery, she received 3 units of pRBC immediately post delivery. She did well postoperatively and her pain was well controlled. She ambulated, tolerated a regular diet, voided and passed flatus. She was found stable for discharge on POD#2. Discharge instructions were reviewed with the patient.  Consults: None  Discharge Exam: Blood pressure 115/88, pulse 61, temperature 97.9 F (36.6 C), temperature source Oral, resp. rate 16, height 5\' 6"  (1.676 m), weight 113.4 kg, last menstrual period 04/21/2022, SpO2 100 %. GENERAL: Well-developed, well-nourished female in no acute distress.  LUNGS: Clear to auscultation bilaterally.  HEART: Regular rate and rhythm. ABDOMEN: Soft, appropriately tender, nondistended. +BS INCISION: honey comb dressing intact EXTREMITIES: No cyanosis, clubbing, or edema, 2+ distal pulses.   Disposition: Home   Allergies as of 05/14/2022       Reactions   Latex Rash        Medication List     STOP taking these medications    acetaminophen 325 MG tablet Commonly known as: TYLENOL   acetaminophen 650 MG CR tablet Commonly known as: TYLENOL       TAKE these medications    B-12 PO Take 1 tablet by mouth daily.   ELDERBERRY PO Take 1 tablet by mouth daily.   gabapentin 300 MG capsule Commonly known as: NEURONTIN Take 300 mg by mouth daily as needed  (pain).   ibuprofen 600 MG tablet Commonly known as: ADVIL Take 1 tablet (600 mg total) by mouth every 6 (six) hours.   IRON PO Take 1 tablet by mouth daily.   linaclotide 145 MCG Caps capsule Commonly known as: LINZESS Take 145 mcg by mouth daily.   magnesium citrate Soln Take 0.5-1 Bottles by mouth daily as needed for severe constipation.   methocarbamol 500 MG tablet Commonly known as: ROBAXIN Take 2 tablets (1,000 mg total) by mouth 2 (two) times daily.   Multi-Vitamin tablet Take 1 tablet by mouth daily.   oxybutynin 10 MG 24 hr tablet Commonly known as: DITROPAN-XL Take 10 mg by mouth daily as needed (bladder spasms).   oxyCODONE-acetaminophen 5-325 MG tablet Commonly known as: PERCOCET/ROXICET Take 1 tablet by mouth every 6 (six) hours as needed for moderate pain or severe pain.   POTASSIUM PO Take 1 tablet by mouth daily.   sertraline 25 MG tablet Commonly known as: ZOLOFT Take 25 mg by mouth daily.   Skyrizi 360 MG/2.4ML Soct Generic drug: Risankizumab-rzaa Inject 360 mg into the skin every 8 (eight) weeks.   STOOL SOFTENER PO Take 1 capsule by mouth daily as needed (constipation).        Follow-up Information     [facility] Follow up in 1 month(s).   Why: An appointment will be scheduled for post op check Contact information: [address] [place] [address] [phone]                Signed: Catalina Antigua 05/14/2022, 9:46 AM

## 2022-05-14 NOTE — Progress Notes (Signed)
Patient given discharge instructions and denies any questions or concerns. Pt off unit at this time via wheelchair.

## 2022-05-14 NOTE — Discharge Instructions (Signed)
Remove honeycomb dressing on Monday, May 6 or sooner if the corners are peeling.

## 2022-05-18 ENCOUNTER — Other Ambulatory Visit: Payer: Medicare HMO

## 2022-05-20 ENCOUNTER — Telehealth: Payer: Self-pay | Admitting: *Deleted

## 2022-05-20 NOTE — Telephone Encounter (Signed)
TC from pt reporting an opening in her incision performed for myomectomy surgery 1 week ago. Advised pt to make an appt for incision check. Pt verbalized understanding. Call transferred to schedulers.

## 2022-05-21 ENCOUNTER — Ambulatory Visit (INDEPENDENT_AMBULATORY_CARE_PROVIDER_SITE_OTHER): Payer: Medicare HMO | Admitting: *Deleted

## 2022-05-21 VITALS — BP 128/89 | HR 79 | Ht 66.0 in | Wt 257.0 lb

## 2022-05-21 DIAGNOSIS — D259 Leiomyoma of uterus, unspecified: Secondary | ICD-10-CM

## 2022-05-21 NOTE — Progress Notes (Signed)
Subjective:     Connie Malone is a 42 y.o. female who presents to the clinic 1 weeks status post  myomectomy with low transverse abdominal incision . Pt reports incision is opened.      Objective:    BP 128/89   Ht 5\' 6"  (1.676 m)   Wt 257 lb (116.6 kg)   LMP 04/21/2022 (Approximate)   BMI 41.48 kg/m  General:  alert, well appearing, in no apparent distress, oriented to person, place and time  Incision:   no drainage, no erythema, no hernia, no seroma, no swelling, slight separation of incision on pt's left side abdomen     Assessment:    Doing well postoperatively. Slight separation of incision on pt's left side abdomen.   Plan:   1. Continue any current medications. 2. Wound care discussed. 3. Follow up: as scheduled 4. Wound cleansed and dried. Steri strips applied to open edges of incision on pt's left side abdomen. 5. .Wound was assessed by Dr. Manya Silvas, RN

## 2022-05-22 ENCOUNTER — Encounter: Payer: Self-pay | Admitting: *Deleted

## 2022-06-16 ENCOUNTER — Encounter: Payer: Self-pay | Admitting: Obstetrics and Gynecology

## 2022-06-16 ENCOUNTER — Ambulatory Visit (INDEPENDENT_AMBULATORY_CARE_PROVIDER_SITE_OTHER): Payer: Medicare HMO | Admitting: Obstetrics and Gynecology

## 2022-06-16 VITALS — BP 130/89 | HR 72 | Wt 260.0 lb

## 2022-06-16 DIAGNOSIS — Z9889 Other specified postprocedural states: Secondary | ICD-10-CM

## 2022-06-16 NOTE — Progress Notes (Signed)
Pt is in office for post op Myomectomy. Pt states she is currently bleeding but it is better than before procedure.

## 2022-06-16 NOTE — Progress Notes (Signed)
42 yo P0 here for post op check. Patient underwent an abdominal myomectomy on 05/12/22. She reports doing well since her surgery and has gradually returned to her regular activities. She reports some occasional incisional pain relieved with ibuprofen. She is without any complaints  Past Medical History:  Diagnosis Date   Anemia    Bladder mass    Blood transfusion without reported diagnosis    Crohn disease (HCC)    Intestinal obstruction (HCC)    Iron deficiency anemia    Microcytic anemia    Past Surgical History:  Procedure Laterality Date   bladder mass  2021   bladder mass at [facility]   CYSTOSCOPY     MYOMECTOMY N/A 05/12/2022   Procedure: ABDOMINAL MYOMECTOMY;  Surgeon: Catalina Antigua, MD;  Location: [facility] OR;  Service: Gynecology;  Laterality: N/A;   UMBILICAL HERNIA REPAIR  03/2022   At [facility] History  Problem Relation Age of Onset   Parkinson's disease Mother    Heart Problems Father    Social History   Tobacco Use   Smoking status: Some Days    Types: Cigarettes   Smokeless tobacco: Never  Vaping Use   Vaping Use: Never used  Substance Use Topics   Alcohol use: Yes    Comment: occasional   Drug use: No   ROS See pertinent in HPI. All other systems reviewed and non contributory Blood pressure 130/89, pulse 72, weight 260 lb (117.9 kg).  GENERAL: Well-developed, well-nourished female in no acute distress.  ABDOMEN: Soft, nontender, nondistended. No organomegaly. Incision: healing well without erythema, induration or drainage PELVIC: Not indicated EXTREMITIES: No cyanosis, clubbing, or edema, 2+ distal pulses.  A/P 42 yo s/p abdominal myomectomy here for post op check - Pathology results reviewed with the patient - Patient is medically cleared to resume all activities of daily living - Plan for IUD removal when ready to conceive - RTC prn

## 2022-06-18 ENCOUNTER — Ambulatory Visit
Admit: 2022-06-18 | Discharge: 2022-06-18 | Disposition: A | Discharge status: Home / Self Care | Payer: Medicare HMO | Attending: Obstetrics and Gynecology | Admitting: Obstetrics and Gynecology

## 2022-06-18 DIAGNOSIS — R928 Other abnormal and inconclusive findings on diagnostic imaging of breast: Secondary | ICD-10-CM

## 2022-06-19 ENCOUNTER — Other Ambulatory Visit: Payer: Self-pay | Admitting: Obstetrics and Gynecology

## 2022-06-19 DIAGNOSIS — N6310 Unspecified lump in the right breast, unspecified quadrant: Secondary | ICD-10-CM

## 2022-06-19 DIAGNOSIS — N6320 Unspecified lump in the left breast, unspecified quadrant: Secondary | ICD-10-CM

## 2022-06-24 ENCOUNTER — Ambulatory Visit
Admission: RE | Admit: 2022-06-24 | Discharge: 2022-06-24 | Disposition: A | Discharge status: Home / Self Care | Payer: Medicare HMO | Source: Ambulatory Visit | Attending: Obstetrics and Gynecology | Admitting: Obstetrics and Gynecology

## 2022-06-24 DIAGNOSIS — N6320 Unspecified lump in the left breast, unspecified quadrant: Secondary | ICD-10-CM

## 2022-06-24 HISTORY — PX: BREAST BIOPSY: SHX20

## 2022-06-26 ENCOUNTER — Ambulatory Visit
Admission: RE | Admit: 2022-06-26 | Discharge: 2022-06-26 | Disposition: A | Discharge status: Home / Self Care | Payer: Medicare HMO | Source: Ambulatory Visit | Attending: Obstetrics and Gynecology | Admitting: Obstetrics and Gynecology

## 2022-06-26 DIAGNOSIS — N6310 Unspecified lump in the right breast, unspecified quadrant: Secondary | ICD-10-CM

## 2022-06-26 HISTORY — PX: BREAST BIOPSY: SHX20

## 2023-03-08 ENCOUNTER — Other Ambulatory Visit: Payer: Self-pay | Admitting: Family Medicine

## 2023-03-08 DIAGNOSIS — E049 Nontoxic goiter, unspecified: Secondary | ICD-10-CM

## 2023-03-12 ENCOUNTER — Ambulatory Visit
Admission: RE | Admit: 2023-03-12 | Discharge: 2023-03-12 | Disposition: A | Discharge status: Home / Self Care | Payer: Medicare HMO | Source: Ambulatory Visit | Attending: Family Medicine | Admitting: Family Medicine

## 2023-03-12 DIAGNOSIS — E049 Nontoxic goiter, unspecified: Secondary | ICD-10-CM

## 2023-03-19 ENCOUNTER — Other Ambulatory Visit: Payer: Self-pay | Admitting: Family Medicine

## 2023-03-19 DIAGNOSIS — E042 Nontoxic multinodular goiter: Secondary | ICD-10-CM

## 2023-04-05 ENCOUNTER — Other Ambulatory Visit: Payer: Self-pay | Admitting: Obstetrics and Gynecology

## 2023-04-05 DIAGNOSIS — Z1231 Encounter for screening mammogram for malignant neoplasm of breast: Secondary | ICD-10-CM

## 2023-04-20 ENCOUNTER — Encounter: Payer: Self-pay | Admitting: Family Medicine

## 2023-04-23 ENCOUNTER — Inpatient Hospital Stay: Admission: RE | Admit: 2023-04-23 | Source: Ambulatory Visit

## 2023-05-06 ENCOUNTER — Ambulatory Visit
Admission: RE | Admit: 2023-05-06 | Discharge: 2023-05-06 | Disposition: A | Discharge status: Home / Self Care | Source: Ambulatory Visit | Attending: Family Medicine | Admitting: Family Medicine

## 2023-05-06 ENCOUNTER — Ambulatory Visit
Admission: RE | Admit: 2023-05-06 | Discharge: 2023-05-06 | Disposition: A | Discharge status: Home / Self Care | Source: Ambulatory Visit | Attending: Family Medicine

## 2023-05-06 ENCOUNTER — Other Ambulatory Visit (HOSPITAL_COMMUNITY)
Admission: RE | Admit: 2023-05-06 | Discharge: 2023-05-06 | Disposition: A | Discharge status: Home / Self Care | Source: Ambulatory Visit | Attending: Family Medicine | Admitting: Family Medicine

## 2023-05-06 DIAGNOSIS — E042 Nontoxic multinodular goiter: Secondary | ICD-10-CM

## 2023-05-10 LAB — CYTOLOGY - NON PAP

## 2023-05-24 ENCOUNTER — Encounter (HOSPITAL_COMMUNITY): Payer: Self-pay

## 2023-05-26 ENCOUNTER — Encounter (INDEPENDENT_AMBULATORY_CARE_PROVIDER_SITE_OTHER): Payer: Self-pay

## 2023-06-23 ENCOUNTER — Ambulatory Visit: Payer: Self-pay | Admitting: Surgery

## 2023-06-28 ENCOUNTER — Ambulatory Visit
Admission: RE | Admit: 2023-06-28 | Discharge: 2023-06-28 | Disposition: A | Discharge status: Home / Self Care | Source: Ambulatory Visit | Attending: Obstetrics and Gynecology | Admitting: Obstetrics and Gynecology

## 2023-06-28 DIAGNOSIS — Z1231 Encounter for screening mammogram for malignant neoplasm of breast: Secondary | ICD-10-CM

## 2023-06-29 NOTE — Patient Instructions (Addendum)
 SURGICAL WAITING ROOM VISITATION  Patients having surgery or a procedure may have no more than 2 support people in the waiting area - these visitors may rotate.    Children under the age of 48 must have an adult with them who is not the patient.  Visitors with respiratory illnesses are discouraged from visiting and should remain at home.  If the patient needs to stay at the hospital during part of their recovery, the visitor guidelines for inpatient rooms apply. Pre-op nurse will coordinate an appropriate time for 1 support person to accompany patient in pre-op.  This support person may not rotate.    Please refer to the [facility] website for the visitor guidelines for Inpatients (after your surgery is over and you are in a regular room).    Your procedure is scheduled on: 07/05/23   Report to [facility] Main Entrance    Report to admitting at 5:15 AM   Call this number if you have problems the morning of surgery [phone]   Do not eat food :After Midnight.   After Midnight you may have the following liquids until 4:30 AM DAY OF SURGERY  Water  Non-Citrus Juices (without pulp, NO RED-Apple, White grape, White cranberry) Black Coffee (NO MILK/CREAM OR CREAMERS, sugar ok)  Clear Tea (NO MILK/CREAM OR CREAMERS, sugar ok) regular and decaf                             Plain Jell-O (NO RED)                                           Fruit ices (not with fruit pulp, NO RED)                                     Popsicles (NO RED)                                                               Sports drinks like Gatorade (NO RED)          If you have questions, please contact your surgeon's office.   FOLLOW BOWEL PREP AND ANY ADDITIONAL PRE OP INSTRUCTIONS YOU RECEIVED FROM YOUR SURGEON'S OFFICE!!!     Oral Hygiene is also important to reduce your risk of infection.                                    Remember - BRUSH YOUR TEETH THE MORNING OF SURGERY WITH YOUR REGULAR  TOOTHPASTE  DENTURES WILL BE REMOVED PRIOR TO SURGERY PLEASE DO NOT APPLY Poly grip OR ADHESIVES!!!   Stop all vitamins and herbal supplements 7 days before surgery.   Take these medicines the morning of surgery with A SIP OF WATER : Oxybutynin, Sertraline    DO NOT TAKE ANY ORAL DIABETIC MEDICATIONS DAY OF YOUR SURGERY  Bring CPAP mask and tubing day of surgery.  You may not have any metal on your body including hair pins, jewelry, and body piercing             Do not wear make-up, lotions, powders, perfumes, or deodorant  Do not wear nail polish including gel and S&S, artificial/acrylic nails, or any other type of covering on natural nails including finger and toenails. If you have artificial nails, gel coating, etc. that needs to be removed by a nail salon please have this removed prior to surgery or surgery may need to be canceled/ delayed if the surgeon/ anesthesia feels like they are unable to be safely monitored.   Do not shave  48 hours prior to surgery.    Do not bring valuables to the hospital. [facility] IS NOT             RESPONSIBLE   FOR VALUABLES.   Contacts, glasses, dentures or bridgework may not be worn into surgery.   Bring small overnight bag day of surgery.   DO NOT BRING YOUR HOME MEDICATIONS TO THE HOSPITAL. PHARMACY WILL DISPENSE MEDICATIONS LISTED ON YOUR MEDICATION LIST TO YOU DURING YOUR ADMISSION IN THE HOSPITAL!   Special Instructions: Bring a copy of your healthcare power of attorney and living will documents the day of surgery if you haven't scanned them before.              Please read over the following fact sheets you were given: IF YOU HAVE QUESTIONS ABOUT YOUR PRE-OP INSTRUCTIONS PLEASE CALL [name]   If you received a COVID test during your pre-op visit  it is requested that you wear a mask when out in public, stay away from anyone that may not be feeling well and notify your surgeon if you develop  symptoms. If you test positive for Covid or have been in contact with anyone that has tested positive in the last 10 days please notify you surgeon.    [facility] - Preparing for Surgery Before surgery, you can play an important role.  Because skin is not sterile, your skin needs to be as free of germs as possible.  You can reduce the number of germs on your skin by washing with CHG (chlorahexidine gluconate) soap before surgery.  CHG is an antiseptic cleaner which kills germs and bonds with the skin to continue killing germs even after washing. Please DO NOT use if you have an allergy to CHG or antibacterial soaps.  If your skin becomes reddened/irritated stop using the CHG and inform your nurse when you arrive at Short Stay. Do not shave (including legs and underarms) for at least 48 hours prior to the first CHG shower.  You may shave your face/neck.  Please follow these instructions carefully:  1.  Shower with CHG Soap the night before surgery and the  morning of surgery.  2.  If you choose to wash your hair, wash your hair first as usual with your normal  shampoo.  3.  After you shampoo, rinse your hair and body thoroughly to remove the shampoo.                             4.  Use CHG as you would any other liquid soap.  You can apply chg directly to the skin and wash.  Gently with a scrungie or clean washcloth.  5.  Apply the CHG Soap to your body ONLY FROM THE NECK DOWN.   Do  not use on face/ open                           Wound or open sores. Avoid contact with eyes, ears mouth and   genitals (private parts).                       Wash face,  Genitals (private parts) with your normal soap.             6.  Wash thoroughly, paying special attention to the area where your    surgery  will be performed.  7.  Thoroughly rinse your body with warm water  from the neck down.  8.  DO NOT shower/wash with your normal soap after using and rinsing off the CHG Soap.                9.  Pat yourself dry  with a clean towel.            10.  Wear clean pajamas.            11.  Place clean sheets on your bed the night of your first shower and do not  sleep with pets. Day of Surgery : Do not apply any lotions/deodorants the morning of surgery.  Please wear clean clothes to the hospital/surgery center.  FAILURE TO FOLLOW THESE INSTRUCTIONS MAY RESULT IN THE CANCELLATION OF YOUR SURGERY  PATIENT SIGNATURE_________________________________  NURSE SIGNATURE__________________________________  ________________________________________________________________________

## 2023-06-30 ENCOUNTER — Encounter (HOSPITAL_COMMUNITY)
Admission: RE | Admit: 2023-06-30 | Discharge: 2023-06-30 | Disposition: A | Discharge status: Home / Self Care | Source: Ambulatory Visit | Attending: Surgery | Admitting: Surgery

## 2023-06-30 ENCOUNTER — Other Ambulatory Visit: Payer: Self-pay

## 2023-06-30 ENCOUNTER — Encounter (HOSPITAL_COMMUNITY): Payer: Self-pay

## 2023-06-30 VITALS — BP 134/87 | HR 69 | Temp 98.4°F | Resp 16 | Ht 67.0 in | Wt 285.0 lb

## 2023-06-30 DIAGNOSIS — D509 Iron deficiency anemia, unspecified: Secondary | ICD-10-CM | POA: Diagnosis not present

## 2023-06-30 DIAGNOSIS — Z01812 Encounter for preprocedural laboratory examination: Secondary | ICD-10-CM | POA: Insufficient documentation

## 2023-06-30 DIAGNOSIS — Z01818 Encounter for other preprocedural examination: Secondary | ICD-10-CM

## 2023-06-30 HISTORY — DX: Anxiety disorder, unspecified: F41.9

## 2023-06-30 HISTORY — DX: Hidradenitis suppurativa: L73.2

## 2023-06-30 HISTORY — DX: Other complications of anesthesia, initial encounter: T88.59XA

## 2023-06-30 HISTORY — DX: Depression, unspecified: F32.A

## 2023-06-30 LAB — CBC
HCT: 39.5 % (ref 36.0–46.0)
Hemoglobin: 11.7 g/dL — ABNORMAL LOW (ref 12.0–15.0)
MCH: 25.2 pg — ABNORMAL LOW (ref 26.0–34.0)
MCHC: 29.6 g/dL — ABNORMAL LOW (ref 30.0–36.0)
MCV: 84.9 fL (ref 80.0–100.0)
Platelets: 286 10*3/uL (ref 150–400)
RBC: 4.65 MIL/uL (ref 3.87–5.11)
RDW: 15.9 % — ABNORMAL HIGH (ref 11.5–15.5)
WBC: 4.1 10*3/uL (ref 4.0–10.5)
nRBC: 0 % (ref 0.0–0.2)

## 2023-06-30 NOTE — Progress Notes (Signed)
 COVID Vaccine Completed:  Date of COVID positive in last 90 days:  PCP - Karlton Overly, MD at [facility] Cardiologist - n/a  Chest x-ray - n/a EKG - yes with eagle, requested Stress Test - n/a ECHO - n/a Cardiac Cath - n/a Pacemaker/ICD device last checked: n/a Spinal Cord Stimulator: n/a  Bowel Prep - no  Sleep Study - yes CPAP - no per pt  Fasting Blood Sugar - n/a Checks Blood Sugar _____ times a day  Last dose of GLP1 agonist-  N/A GLP1 instructions:  Hold 7 days before surgery    Last dose of SGLT-2 inhibitors-  N/A SGLT-2 instructions:  Hold 3 days before surgery    Blood Thinner Instructions:  Last dose:  n/a  Time: Aspirin Instructions: Last Dose:  Activity level: Can go up a flight of stairs and perform activities of daily living without stopping and without symptoms of chest pain or shortness of breath.  Anesthesia review:   Patient denies shortness of breath, fever, cough and chest pain at PAT appointment  Patient verbalized understanding of instructions that were given to them at the PAT appointment. Patient was also instructed that they will need to review over the PAT instructions again at home before surgery.

## 2023-07-04 ENCOUNTER — Encounter (HOSPITAL_COMMUNITY): Payer: Self-pay | Admitting: Surgery

## 2023-07-04 DIAGNOSIS — D440 Neoplasm of uncertain behavior of thyroid gland: Secondary | ICD-10-CM | POA: Diagnosis present

## 2023-07-04 DIAGNOSIS — E042 Nontoxic multinodular goiter: Secondary | ICD-10-CM | POA: Diagnosis present

## 2023-07-04 DIAGNOSIS — Z1509 Genetic susceptibility to other malignant neoplasm: Secondary | ICD-10-CM

## 2023-07-04 NOTE — Anesthesia Preprocedure Evaluation (Signed)
 Anesthesia Evaluation  Patient identified by MRN, date of birth, ID band Patient awake    History of Anesthesia Complications Negative for: history of anesthetic complications  Airway Mallampati: I  TM Distance: >3 FB Neck ROM: Full    Dental  (+) Dental Advisory Given   Pulmonary neg pulmonary ROS, former smoker   breath sounds clear to auscultation       Cardiovascular negative cardio ROS  Rhythm:Regular Rate:Normal     Neuro/Psych   Anxiety Depression    negative neurological ROS     GI/Hepatic negative GI ROS, Neg liver ROS,,,Crohn's   Endo/Other  BMI 45  Renal/GU negative Renal ROS     Musculoskeletal   Abdominal   Peds  Hematology Hb 11.7, plt 286k   Anesthesia Other Findings   Reproductive/Obstetrics                             Anesthesia Physical Anesthesia Plan  ASA: 3  Anesthesia Plan: General   Post-op Pain Management: Tylenol  PO (pre-op)*   Induction: Intravenous  PONV Risk Score and Plan: 3 and Ondansetron , Dexamethasone  and Scopolamine patch - Pre-op  Airway Management Planned: Oral ETT  Additional Equipment: None  Intra-op Plan:   Post-operative Plan: Extubation in OR  Informed Consent: I have reviewed the patients History and Physical, chart, labs and discussed the procedure including the risks, benefits and alternatives for the proposed anesthesia with the patient or authorized representative who has indicated his/her understanding and acceptance.     Dental advisory given  Plan Discussed with: CRNA and Surgeon  Anesthesia Plan Comments:        Anesthesia Quick Evaluation

## 2023-07-04 NOTE — H&P (Signed)
 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 Patient noted on self-examination a few months ago an enlarging mass in the anterior neck. She presented to her primary care provider and was referred for ultrasound. Ultrasound in early April demonstrated an enlarged thyroid  gland with a 2.5 cm nodule in the isthmus and a 1.2 cm nodule in the right thyroid  lobe. These were felt to be suspicious and fine-needle aspiration biopsies were performed. Atypia was noted. The specimen was submitted for molecular genetic testing with AFIRMA. Results on the nodule from the thyroid  isthmus returned as suspicious and an NRAS mutation was identified raising the risk of malignancy to 75%. Patient is therefore referred to surgery for consideration for thyroidectomy due to the elevated risk of malignancy. Patient has no prior history of thyroid  disease. She has never been on thyroid  medication. She has had no prior head or neck surgery. There is no family history of thyroid  disease and specifically no family history of thyroid  cancer. Patient has noted some changes in the overall quality of her voice.  Review of Systems: A complete review of systems was obtained from the patient. I have reviewed this information and discussed as appropriate with the patient. See HPI as well for other ROS.  Review of Systems  Constitutional: Negative.  HENT: Negative.  Eyes: Negative.  Respiratory: Negative.  Cardiovascular: Negative.  Gastrointestinal: Negative.  Genitourinary: Negative.  Musculoskeletal: Negative.  Skin: Negative.  Neurological: Negative.  Endo/Heme/Allergies: Negative.   Psychiatric/Behavioral: Negative.    Medical History: Past Medical History:  Diagnosis Date  Anemia  Anxiety   Patient Active Problem List  Diagnosis  Neoplasm of uncertain behavior of thyroid  gland  Monoallelic mutation of NRAS gene  Multiple thyroid  nodules   Past Surgical History:  Procedure Laterality Date  HERNIA REPAIR    No Known Allergies  Current Outpatient Medications on File Prior to Visit  Medication Sig Dispense Refill  doxycycline  (VIBRA -TABS) 100 MG tablet 1 tablet Orally Once a day for 60 days  ferrous sulfate 325 (65 FE) MG tablet 1 tablet Orally Three times a Week  methocarbamoL  (ROBAXIN ) 500 MG tablet Take 1,000 mg by mouth 2 (two) times daily  oxyBUTYnin (DITROPAN-XL) 10 MG XL tablet TAKE 1 TABLET BY MOUTH DAILY Oral for 30 Days  sertraline  (ZOLOFT ) 25 MG tablet Take 25 mg by mouth once daily  SKYRIZI 360 mg/2.4 mL (150 mg/mL) injector Inject 360 mg subcutaneously every 8 (eight) weeks  traZODone (DESYREL) 100 MG tablet Take 100 mg by mouth at bedtime   No current facility-administered medications on file prior to visit.   Family History  Problem Relation Age of Onset  Obesity Mother  High blood pressure (Hypertension) Mother  Coronary Artery Disease (Blocked arteries around heart) Father  High blood pressure (Hypertension) Father  Obesity Brother  Diabetes Brother    Social History   Tobacco Use  Smoking Status Former  Types: Cigarettes  Smokeless Tobacco Never    Social History   Socioeconomic History  Marital status: Unknown  Tobacco Use  Smoking status: Former  Types: Cigarettes  Smokeless tobacco: Never  Substance and Sexual Activity  Drug use: Never   Social Drivers of Corporate investment banker  Strain: Medium Risk (07/11/2019)  Received from [facility] visits prior to 03/14/2022.  Overall Financial Resource Strain (CARDIA)  Difficulty of Paying Living Expenses: Somewhat hard  Food Insecurity: No  Food Insecurity (05/13/2022)  [facility] from [facility]  Hunger Vital Sign  Within the past 12 months, you worried that your food would run out before you got the money to buy more.: Never true  Within the past 12 months, the food you bought just didn't last and you didn't have money to get more.: Never true  Transportation Needs: No Transportation Needs (05/13/2022)  Received from [facility] - Transportation  Lack of Transportation (Medical): No  Lack of Transportation (Non-Medical): No  Housing Stability: Low Risk (03/27/2022)  Received from [employer] Stability Vital Sign  What is your living situation today?: I have a steady place to live  Think about the place you live. Do you have problems with any of the following? Choose all that apply:: None/None on this list   Objective:   Vitals:  BP: 122/83  Pulse: 74  Temp: 36.7 C (98 F)  Weight: (!) 129.3 kg (285 lb)  Height: 170.2 cm (5' 7)   Body mass index is 44.64 kg/m.  Physical Exam   GENERAL APPEARANCE Comfortable, no acute issues Development: normal Gross deformities: none  SKIN Rash, lesions, ulcers: none Induration, erythema: none Nodules: none palpable  EYES Conjunctiva and lids: normal Pupils: equal  EARS, NOSE, MOUTH, THROAT External ears: no lesion or deformity External nose: no lesion or deformity Hearing: grossly normal  NECK Symmetric: yes Trachea: midline Thyroid : There is a palpable smooth relatively soft 3 cm nodule in the midline at the level of the thyroid  isthmus. Palpation of the left and right lobes of the thyroid  did not reveal any significant nodules. There is no associated lymphadenopathy.  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  Patient provided with a copy of The Thyroid  Book: Medical and Surgical Treatment of Thyroid  Problems, published by [employer], 16 pages. Book reviewed and explained to patient during visit today.  Today we reviewed her clinical history. We reviewed her ultrasound report. We reviewed her cytology results. We reviewed the results of her molecular genetic testing. Copies of the studies are provided to the patient. Based on these findings, the patient needs to undergo total thyroidectomy for definitive diagnosis and management. We discussed thyroidectomy in detail. We discussed the risk and benefits including the risk of recurrent laryngeal nerve injury and injury to parathyroid glands. We discussed the size and location of the surgical incision. We discussed the hospital stay to be anticipated. We discussed her postoperative recovery. We discussed the need for lifelong thyroid  hormone replacement therapy. The patient understands and agrees to proceed.  Krystal Spinner, MD [facility] practice Office: [phone]

## 2023-07-05 ENCOUNTER — Ambulatory Visit (HOSPITAL_COMMUNITY): Payer: Self-pay | Admitting: Anesthesiology

## 2023-07-05 ENCOUNTER — Other Ambulatory Visit: Payer: Self-pay

## 2023-07-05 ENCOUNTER — Ambulatory Visit (HOSPITAL_COMMUNITY)
Admission: RE | Admit: 2023-07-05 | Discharge: 2023-07-06 | Disposition: A | Discharge status: Home / Self Care | Attending: Surgery | Admitting: Surgery

## 2023-07-05 ENCOUNTER — Encounter (HOSPITAL_COMMUNITY)
Admission: RE | Disposition: A | Discharge status: Home / Self Care | Payer: Self-pay | Source: Home / Self Care | Attending: Surgery

## 2023-07-05 ENCOUNTER — Encounter (HOSPITAL_COMMUNITY): Payer: Self-pay | Admitting: Surgery

## 2023-07-05 DIAGNOSIS — D440 Neoplasm of uncertain behavior of thyroid gland: Secondary | ICD-10-CM

## 2023-07-05 DIAGNOSIS — E042 Nontoxic multinodular goiter: Secondary | ICD-10-CM

## 2023-07-05 DIAGNOSIS — F32A Depression, unspecified: Secondary | ICD-10-CM | POA: Insufficient documentation

## 2023-07-05 DIAGNOSIS — D34 Benign neoplasm of thyroid gland: Secondary | ICD-10-CM | POA: Insufficient documentation

## 2023-07-05 DIAGNOSIS — F418 Other specified anxiety disorders: Secondary | ICD-10-CM

## 2023-07-05 DIAGNOSIS — D489 Neoplasm of uncertain behavior, unspecified: Secondary | ICD-10-CM | POA: Diagnosis not present

## 2023-07-05 DIAGNOSIS — D509 Iron deficiency anemia, unspecified: Secondary | ICD-10-CM

## 2023-07-05 DIAGNOSIS — Z5986 Financial insecurity: Secondary | ICD-10-CM | POA: Diagnosis not present

## 2023-07-05 DIAGNOSIS — Z87891 Personal history of nicotine dependence: Secondary | ICD-10-CM | POA: Insufficient documentation

## 2023-07-05 DIAGNOSIS — F419 Anxiety disorder, unspecified: Secondary | ICD-10-CM | POA: Insufficient documentation

## 2023-07-05 DIAGNOSIS — Z1509 Genetic susceptibility to other malignant neoplasm: Secondary | ICD-10-CM

## 2023-07-05 DIAGNOSIS — Z1589 Genetic susceptibility to other disease: Secondary | ICD-10-CM | POA: Insufficient documentation

## 2023-07-05 DIAGNOSIS — Z01818 Encounter for other preprocedural examination: Secondary | ICD-10-CM

## 2023-07-05 HISTORY — PX: THYROIDECTOMY: SHX17

## 2023-07-05 LAB — POCT PREGNANCY, URINE: Preg Test, Ur: NEGATIVE

## 2023-07-05 SURGERY — THYROIDECTOMY
Anesthesia: General

## 2023-07-05 MED ORDER — STERILE WATER FOR IRRIGATION IR SOLN
Status: DC | PRN
  Administered 2023-07-05: 1000 mL

## 2023-07-05 MED ORDER — HYDROMORPHONE HCL 1 MG/ML IJ SOLN
1.0000 mg | INTRAMUSCULAR | Status: DC | PRN
  Administered 2023-07-05: 1 mg via INTRAVENOUS
  Filled 2023-07-05: qty 1

## 2023-07-05 MED ORDER — TRAMADOL HCL 50 MG PO TABS
50.0000 mg | ORAL_TABLET | Freq: Four times a day (QID) | ORAL | Status: DC | PRN
  Administered 2023-07-05 – 2023-07-06 (×2): 50 mg via ORAL
  Filled 2023-07-05 (×2): qty 1

## 2023-07-05 MED ORDER — ONDANSETRON 4 MG PO TBDP: 4.0000 mg | ORAL_TABLET | Freq: Four times a day (QID) | ORAL | Status: DC | PRN

## 2023-07-05 MED ORDER — HYDROMORPHONE HCL 2 MG/ML IJ SOLN
INTRAMUSCULAR | Status: AC
Start: 2023-07-05 — End: 2023-07-05
  Filled 2023-07-05: qty 1

## 2023-07-05 MED ORDER — CHLORHEXIDINE GLUCONATE CLOTH 2 % EX PADS: 6.0000 | MEDICATED_PAD | Freq: Once | CUTANEOUS | Status: DC

## 2023-07-05 MED ORDER — DEXAMETHASONE SODIUM PHOSPHATE 10 MG/ML IJ SOLN
INTRAMUSCULAR | Status: AC
  Filled 2023-07-05: qty 1

## 2023-07-05 MED ORDER — 0.9 % SODIUM CHLORIDE (POUR BTL) OPTIME
TOPICAL | Status: DC | PRN
Start: 2023-07-05 — End: 2023-07-05
  Administered 2023-07-05: 1000 mL

## 2023-07-05 MED ORDER — DIPHENHYDRAMINE HCL 25 MG PO CAPS
25.0000 mg | ORAL_CAPSULE | Freq: Four times a day (QID) | ORAL | Status: DC | PRN
  Administered 2023-07-05 (×2): 25 mg via ORAL
  Filled 2023-07-05 (×2): qty 1

## 2023-07-05 MED ORDER — OXYCODONE HCL 5 MG/5ML PO SOLN: 5.0000 mg | Freq: Once | ORAL | Status: DC | PRN

## 2023-07-05 MED ORDER — KETAMINE HCL 50 MG/5ML IJ SOSY
PREFILLED_SYRINGE | INTRAMUSCULAR | Status: AC
  Filled 2023-07-05: qty 5

## 2023-07-05 MED ORDER — HEMOSTATIC AGENTS (NO CHARGE) OPTIME
TOPICAL | Status: DC | PRN
  Administered 2023-07-05: 1

## 2023-07-05 MED ORDER — OXYCODONE HCL 5 MG PO TABS
5.0000 mg | ORAL_TABLET | ORAL | Status: DC | PRN
  Administered 2023-07-05 – 2023-07-06 (×4): 10 mg via ORAL
  Filled 2023-07-05 (×4): qty 2

## 2023-07-05 MED ORDER — CALCIUM CARBONATE 1250 (500 CA) MG PO TABS
2.0000 | ORAL_TABLET | Freq: Three times a day (TID) | ORAL | Status: DC
  Administered 2023-07-05 – 2023-07-06 (×3): 2500 mg via ORAL
  Filled 2023-07-05 (×3): qty 2

## 2023-07-05 MED ORDER — PROPOFOL 500 MG/50ML IV EMUL
INTRAVENOUS | Status: AC
  Filled 2023-07-05: qty 50

## 2023-07-05 MED ORDER — HYDROMORPHONE HCL 1 MG/ML IJ SOLN
INTRAMUSCULAR | Status: AC
Start: 2023-07-05 — End: 2023-07-05
  Filled 2023-07-05: qty 2

## 2023-07-05 MED ORDER — ACETAMINOPHEN 325 MG PO TABS: 650.0000 mg | ORAL_TABLET | Freq: Four times a day (QID) | ORAL | Status: DC | PRN

## 2023-07-05 MED ORDER — SCOPOLAMINE 1 MG/3DAYS TD PT72
1.0000 | MEDICATED_PATCH | TRANSDERMAL | Status: DC
  Administered 2023-07-05: 1.5 mg via TRANSDERMAL
  Filled 2023-07-05: qty 1

## 2023-07-05 MED ORDER — CEFAZOLIN SODIUM-DEXTROSE 3-4 GM/150ML-% IV SOLN
3.0000 g | INTRAVENOUS | Status: AC
  Administered 2023-07-05: 3 g via INTRAVENOUS
  Filled 2023-07-05: qty 150

## 2023-07-05 MED ORDER — ONDANSETRON HCL 4 MG/2ML IJ SOLN: 4.0000 mg | Freq: Four times a day (QID) | INTRAMUSCULAR | Status: DC | PRN

## 2023-07-05 MED ORDER — DEXMEDETOMIDINE HCL IN NACL 80 MCG/20ML IV SOLN
INTRAVENOUS | Status: AC
  Filled 2023-07-05: qty 20

## 2023-07-05 MED ORDER — MIDAZOLAM HCL 2 MG/2ML IJ SOLN
INTRAMUSCULAR | Status: AC
  Filled 2023-07-05: qty 2

## 2023-07-05 MED ORDER — FENTANYL CITRATE (PF) 100 MCG/2ML IJ SOLN
INTRAMUSCULAR | Status: DC | PRN
  Administered 2023-07-05: 150 ug via INTRAVENOUS
  Administered 2023-07-05 (×2): 50 ug via INTRAVENOUS

## 2023-07-05 MED ORDER — ORAL CARE MOUTH RINSE: 15.0000 mL | Freq: Once | OROMUCOSAL | Status: AC

## 2023-07-05 MED ORDER — PROPOFOL 1000 MG/100ML IV EMUL
INTRAVENOUS | Status: AC
  Filled 2023-07-05: qty 200

## 2023-07-05 MED ORDER — PHENYLEPHRINE HCL-NACL 20-0.9 MG/250ML-% IV SOLN
INTRAVENOUS | Status: DC | PRN
  Administered 2023-07-05: 20 ug/min via INTRAVENOUS

## 2023-07-05 MED ORDER — PROPOFOL 10 MG/ML IV BOLUS
INTRAVENOUS | Status: DC | PRN
  Administered 2023-07-05: 200 mg via INTRAVENOUS

## 2023-07-05 MED ORDER — MIDAZOLAM HCL 2 MG/2ML IJ SOLN: 0.5000 mg | Freq: Once | INTRAMUSCULAR | Status: DC | PRN

## 2023-07-05 MED ORDER — ROCURONIUM BROMIDE 10 MG/ML (PF) SYRINGE
PREFILLED_SYRINGE | INTRAVENOUS | Status: AC
  Filled 2023-07-05: qty 10

## 2023-07-05 MED ORDER — ACETAMINOPHEN 500 MG PO TABS
1000.0000 mg | ORAL_TABLET | Freq: Once | ORAL | Status: AC
  Administered 2023-07-05: 1000 mg via ORAL
  Filled 2023-07-05: qty 2

## 2023-07-05 MED ORDER — TRAZODONE HCL 100 MG PO TABS
100.0000 mg | ORAL_TABLET | Freq: Every day | ORAL | Status: DC
  Administered 2023-07-05: 100 mg via ORAL
  Filled 2023-07-05: qty 1

## 2023-07-05 MED ORDER — DEXAMETHASONE SODIUM PHOSPHATE 10 MG/ML IJ SOLN
INTRAMUSCULAR | Status: DC | PRN
  Administered 2023-07-05: 8 mg via INTRAVENOUS

## 2023-07-05 MED ORDER — OXYCODONE HCL 5 MG PO TABS: 5.0000 mg | ORAL_TABLET | Freq: Once | ORAL | Status: DC | PRN

## 2023-07-05 MED ORDER — ACETAMINOPHEN 10 MG/ML IV SOLN
INTRAVENOUS | Status: AC
  Filled 2023-07-05: qty 100

## 2023-07-05 MED ORDER — HYDROMORPHONE HCL 1 MG/ML IJ SOLN
0.2500 mg | INTRAMUSCULAR | Status: DC | PRN
  Administered 2023-07-05 (×3): 0.5 mg via INTRAVENOUS

## 2023-07-05 MED ORDER — SUGAMMADEX SODIUM 200 MG/2ML IV SOLN
INTRAVENOUS | Status: DC | PRN
  Administered 2023-07-05: 300 mg via INTRAVENOUS

## 2023-07-05 MED ORDER — LIDOCAINE HCL (CARDIAC) PF 100 MG/5ML IV SOSY
PREFILLED_SYRINGE | INTRAVENOUS | Status: DC | PRN
  Administered 2023-07-05: 40 mg via INTRAVENOUS

## 2023-07-05 MED ORDER — LIDOCAINE HCL 2 % IJ SOLN
INTRAMUSCULAR | Status: AC
  Filled 2023-07-05: qty 20

## 2023-07-05 MED ORDER — LIDOCAINE HCL (PF) 2 % IJ SOLN
INTRAMUSCULAR | Status: AC
  Filled 2023-07-05: qty 5

## 2023-07-05 MED ORDER — CHLORHEXIDINE GLUCONATE 0.12 % MT SOLN
15.0000 mL | Freq: Once | OROMUCOSAL | Status: AC
  Administered 2023-07-05: 15 mL via OROMUCOSAL

## 2023-07-05 MED ORDER — LACTATED RINGERS IV SOLN: INTRAVENOUS | Status: DC

## 2023-07-05 MED ORDER — SERTRALINE HCL 50 MG PO TABS
50.0000 mg | ORAL_TABLET | Freq: Every day | ORAL | Status: DC
  Administered 2023-07-05 – 2023-07-06 (×2): 50 mg via ORAL
  Filled 2023-07-05 (×2): qty 1

## 2023-07-05 MED ORDER — MIDAZOLAM HCL 5 MG/5ML IJ SOLN
INTRAMUSCULAR | Status: DC | PRN
  Administered 2023-07-05: 2 mg via INTRAVENOUS

## 2023-07-05 MED ORDER — PROPOFOL 10 MG/ML IV BOLUS
INTRAVENOUS | Status: AC
  Filled 2023-07-05: qty 20

## 2023-07-05 MED ORDER — HYDROMORPHONE HCL 1 MG/ML IJ SOLN
INTRAMUSCULAR | Status: DC | PRN
  Administered 2023-07-05 (×9): .2 mg via INTRAVENOUS

## 2023-07-05 MED ORDER — ACETAMINOPHEN 650 MG RE SUPP: 650.0000 mg | Freq: Four times a day (QID) | RECTAL | Status: DC | PRN

## 2023-07-05 MED ORDER — ROCURONIUM BROMIDE 100 MG/10ML IV SOLN
INTRAVENOUS | Status: DC | PRN
  Administered 2023-07-05: 70 mg via INTRAVENOUS

## 2023-07-05 MED ORDER — FENTANYL CITRATE (PF) 250 MCG/5ML IJ SOLN
INTRAMUSCULAR | Status: AC
  Filled 2023-07-05: qty 5

## 2023-07-05 MED ORDER — SODIUM CHLORIDE 0.45 % IV SOLN: INTRAVENOUS | Status: AC

## 2023-07-05 MED ORDER — ONDANSETRON HCL 4 MG/2ML IJ SOLN
INTRAMUSCULAR | Status: AC
  Filled 2023-07-05: qty 2

## 2023-07-05 MED ORDER — LIDOCAINE HCL (PF) 2 % IJ SOLN
INTRAMUSCULAR | Status: DC | PRN
  Administered 2023-07-05: 1.5 mg/kg/h via INTRADERMAL

## 2023-07-05 MED ORDER — ONDANSETRON HCL 4 MG/2ML IJ SOLN
INTRAMUSCULAR | Status: DC | PRN
  Administered 2023-07-05: 4 mg via INTRAVENOUS

## 2023-07-05 MED ORDER — KETAMINE HCL 50 MG/5ML IJ SOSY
PREFILLED_SYRINGE | INTRAMUSCULAR | Status: DC | PRN
  Administered 2023-07-05: 20 mg via INTRAVENOUS
  Administered 2023-07-05: 10 mg via INTRAVENOUS

## 2023-07-05 MED ORDER — MEPERIDINE HCL 50 MG/ML IJ SOLN: 6.2500 mg | INTRAMUSCULAR | Status: DC | PRN

## 2023-07-05 SURGICAL SUPPLY — 27 items
BAG COUNTER SPONGE SURGICOUNT (BAG) ×2 IMPLANT
BLADE SURG 15 STRL LF DISP TIS (BLADE) ×2 IMPLANT
CHLORAPREP W/TINT 26 (MISCELLANEOUS) ×2 IMPLANT
CLIP TI MEDIUM 6 (CLIP) ×4 IMPLANT
CLIP TI WIDE RED SMALL 6 (CLIP) ×4 IMPLANT
COVER SURGICAL LIGHT HANDLE (MISCELLANEOUS) ×2 IMPLANT
DERMABOND ADVANCED .7 DNX12 (GAUZE/BANDAGES/DRESSINGS) ×2 IMPLANT
DRAPE LAPAROTOMY T 98X78 PEDS (DRAPES) ×2 IMPLANT
DRAPE UTILITY XL STRL (DRAPES) ×2 IMPLANT
ELECT PENCIL ROCKER SW 15FT (MISCELLANEOUS) ×2 IMPLANT
ELECT REM PT RETURN 15FT ADLT (MISCELLANEOUS) ×2 IMPLANT
GAUZE 4X4 16PLY ~~LOC~~+RFID DBL (SPONGE) ×2 IMPLANT
GLOVE SURG ORTHO 8.0 STRL STRW (GLOVE) ×2 IMPLANT
GOWN STRL REUS W/ TWL XL LVL3 (GOWN DISPOSABLE) ×4 IMPLANT
HEMOSTAT SURGICEL 2X4 FIBR (HEMOSTASIS) ×2 IMPLANT
ILLUMINATOR WAVEGUIDE N/F (MISCELLANEOUS) ×2 IMPLANT
KIT BASIN OR (CUSTOM PROCEDURE TRAY) ×2 IMPLANT
KIT TURNOVER KIT A (KITS) ×2 IMPLANT
PACK BASIC VI WITH GOWN DISP (CUSTOM PROCEDURE TRAY) ×2 IMPLANT
PAD MAGNETIC INSTR ST 16X20 (MISCELLANEOUS) ×2 IMPLANT
SHEARS HARMONIC 9CM CVD (BLADE) ×2 IMPLANT
SUT MNCRL AB 4-0 PS2 18 (SUTURE) ×2 IMPLANT
SUT SILK 3 0 SH 30 (SUTURE) ×2 IMPLANT
SUT VIC AB 3-0 SH 18 (SUTURE) ×4 IMPLANT
SYR BULB IRRIG 60ML STRL (SYRINGE) ×2 IMPLANT
TOWEL OR 17X26 10 PK STRL BLUE (TOWEL DISPOSABLE) ×2 IMPLANT
TUBING CONNECTING 10 (TUBING) ×2 IMPLANT

## 2023-07-05 NOTE — Interval H&P Note (Signed)
 History and Physical Interval Note:  07/05/2023 7:03 AM  Connie Malone  has presented today for surgery, with the diagnosis of THYROID  NEOPLASM OF UNCERTAIN BEHAVIOR NRAS MUTATION.  The various methods of treatment have been discussed with the patient and family. After consideration of risks, benefits and other options for treatment, the patient has consented to    Procedure(s) with comments: THYROIDECTOMY (N/A) - TOTAL THYROIDECTOMY as a surgical intervention.    The patient's history has been reviewed, patient examined, no change in status, stable for surgery.  I have reviewed the patient's chart and labs.  Questions were answered to the patient's satisfaction.    Krystal Spinner, MD [facility] practice Office: [phone]   Krystal Spinner

## 2023-07-05 NOTE — Discharge Instructions (Signed)

## 2023-07-05 NOTE — Transfer of Care (Signed)
 Immediate Anesthesia Transfer of Care Note  Patient: Connie Malone  Procedure(s) Performed: THYROIDECTOMY  Patient Location: PACU  Anesthesia Type:General  Level of Consciousness: awake  Airway & Oxygen Therapy: Patient Spontanous Breathing and Patient connected to nasal cannula oxygen  Post-op Assessment: Report given to RN and Post -op Vital signs reviewed and stable  Post vital signs: Reviewed and stable  Last Vitals:  Vitals Value Taken Time  BP 145/98 07/05/23 10:00  Temp    Pulse 69 07/05/23 10:04  Resp 10 07/05/23 10:04  SpO2 100 % 07/05/23 10:04  Vitals shown include unfiled device data.  Last Pain:  Vitals:   07/05/23 0606  TempSrc:   PainSc: 5          Complications: No notable events documented. +hoarse voice in PACU, breathing spontaneously/comfortably

## 2023-07-05 NOTE — Op Note (Signed)
 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 Patient noted on self-examination a few months ago an enlarging mass in the anterior neck. She presented to her primary care provider and was referred for ultrasound. Ultrasound in early April demonstrated an enlarged thyroid  gland with a 2.5 cm nodule in the isthmus and a 1.2 cm nodule in the right thyroid  lobe. These were felt to be suspicious and fine-needle aspiration biopsies were performed. Atypia was noted. The specimen was submitted for molecular genetic testing with AFIRMA. Results on the nodule from the thyroid  isthmus returned as suspicious and an NRAS mutation was identified raising the risk of malignancy to 75%. Patient is therefore referred to surgery for consideration for thyroidectomy due to the elevated risk of malignancy.   Procedure Details: Procedure was done in OR #1 at the [facility]. The patient was brought to the operating room and placed in a supine position on the operating room table. Following administration of general anesthesia, the patient was positioned and then prepped and draped in the usual aseptic fashion. After ascertaining that an adequate level of anesthesia had been achieved, a small Kocher incision was made with #15 blade. Dissection was carried through subcutaneous tissues and platysma.Hemostasis was achieved with the electrocautery. Skin flaps were elevated cephalad and caudad from the thyroid  notch to the sternal notch. A Mahorner  self-retaining retractor was placed for exposure. Strap muscles were incised in the midline and dissection was begun on the left side.  Strap muscles were reflected laterally.  Left thyroid  lobe was mildly enlarged with a dominant nodule in the thyroid  isthmus.  The left lobe was gently mobilized with blunt dissection. Superior pole vessels were dissected out and divided individually between small and medium ligaclips with the harmonic scalpel. The thyroid  lobe was rolled anteriorly. Branches of the inferior thyroid  artery were divided between small ligaclips with the harmonic scalpel. Inferior venous tributaries were divided between ligaclips. Both the superior and inferior parathyroid glands were identified and preserved on their vascular pedicles. The recurrent laryngeal nerve was identified and preserved along its course. The ligament of Court was released with the electrocautery and the gland was mobilized onto the anterior trachea. Isthmus was mobilized across the midline. There was a moderate sized pyramidal lobe present which was resected off of the thyroid  cartilage and resected en bloc with the isthmus. Dry pack was placed in the left neck.  The right thyroid  lobe was gently mobilized with blunt dissection. Right thyroid  lobe was mildly enlarged and nodular. Superior pole vessels were dissected out and divided between small and medium ligaclips with the Harmonic scalpel. Superior parathyroid was identified and preserved. Inferior venous tributaries were divided between medium ligaclips with the harmonic scalpel. The right thyroid  lobe was rolled anteriorly and the branches of the inferior thyroid  artery divided between small ligaclips. The right recurrent laryngeal nerve was identified and preserved along its course. The ligament of Court was released with the electrocautery. The right thyroid  lobe was mobilized onto the anterior trachea  and the remainder of the thyroid  was dissected off the anterior  trachea and the thyroid  was completely excised. A suture was used to mark each lobe. The entire thyroid  gland was submitted to pathology for review.  Palpation of the operative field demonstrated no evidence of residual disease and no abnormal lymph nodes.  The neck was irrigated with warm saline. Fibrillar was placed throughout the operative field. Strap muscles were approximated in the midline with interrupted 3-0 Vicryl sutures. Platysma was closed with interrupted 3-0 Vicryl sutures. Skin was closed with a running 4-0 Monocryl subcuticular suture. Wound was washed and Dermabond was applied. The patient was awakened from anesthesia and brought to the recovery room. The patient tolerated the procedure well.   Krystal Spinner, MD [facility] Surgery Office: [phone]

## 2023-07-05 NOTE — Anesthesia Procedure Notes (Signed)
 Procedure Name: Intubation Date/Time: 07/05/2023 7:47 AM  Performed by: Belvie Valri NOVAK, CRNAPre-anesthesia Checklist: Patient identified, Emergency Drugs available, Suction available and Patient being monitored Patient Re-evaluated:Patient Re-evaluated prior to induction Oxygen Delivery Method: Circle System Utilized Preoxygenation: Pre-oxygenation with 100% oxygen Induction Type: IV induction Ventilation: Mask ventilation without difficulty and Oral airway inserted - appropriate to patient size Laryngoscope Size: Mac and 3 Grade View: Grade II Tube type: Oral Tube size: 7.0 mm Number of attempts: 1 Airway Equipment and Method: Stylet and Oral airway Placement Confirmation: ETT inserted through vocal cords under direct vision, positive ETCO2 and breath sounds checked- equal and bilateral Secured at: 22 cm Tube secured with: Tape Dental Injury: Teeth and Oropharynx as per pre-operative assessment

## 2023-07-05 NOTE — Anesthesia Postprocedure Evaluation (Signed)
 Anesthesia Post Note  Patient: Connie Malone  Procedure(s) Performed: THYROIDECTOMY     Patient location during evaluation: PACU Anesthesia Type: General Level of consciousness: awake and alert, oriented and patient cooperative Pain management: pain level controlled Vital Signs Assessment: post-procedure vital signs reviewed and stable Respiratory status: spontaneous breathing, nonlabored ventilation and respiratory function stable Cardiovascular status: blood pressure returned to baseline and stable Postop Assessment: no apparent nausea or vomiting Anesthetic complications: no  No notable events documented.  Last Vitals:  Vitals:   07/05/23 1045 07/05/23 1100  BP: (!) 147/98   Pulse: (!) 58 (!) 55  Resp: 12 12  Temp:    SpO2: 92% 97%    Last Pain:  Vitals:   07/05/23 1100  TempSrc:   PainSc: 4                  Simmie Camerer,E. Tashawn Greff

## 2023-07-06 ENCOUNTER — Encounter (HOSPITAL_COMMUNITY): Payer: Self-pay | Admitting: Surgery

## 2023-07-06 DIAGNOSIS — D34 Benign neoplasm of thyroid gland: Secondary | ICD-10-CM | POA: Diagnosis not present

## 2023-07-06 LAB — SURGICAL PATHOLOGY

## 2023-07-06 LAB — CALCIUM: Calcium: 9.2 mg/dL (ref 8.9–10.3)

## 2023-07-06 MED ORDER — LEVOTHYROXINE SODIUM 100 MCG PO TABS: 100.0000 ug | ORAL_TABLET | Freq: Every day | ORAL | 3 refills | Status: AC

## 2023-07-06 MED ORDER — TRAMADOL HCL 50 MG PO TABS: 50.0000 mg | ORAL_TABLET | Freq: Four times a day (QID) | ORAL | 0 refills | Status: AC | PRN

## 2023-07-06 MED ORDER — CALCIUM CARBONATE ANTACID 500 MG PO CHEW: 2.0000 | CHEWABLE_TABLET | Freq: Two times a day (BID) | ORAL | 1 refills | Status: AC

## 2023-07-06 NOTE — Progress Notes (Signed)
   07/06/23 1039  TOC Brief Assessment  Insurance and Status Reviewed  Patient has primary care physician Yes  Home environment has been reviewed resides in private residence  Prior level of function: Independent  Prior/Current Home Services No current home services  Social Drivers of Health Review SDOH reviewed no interventions necessary  Readmission risk has been reviewed Yes  Transition of care needs no transition of care needs at this time

## 2023-07-06 NOTE — Discharge Summary (Signed)
 Physician Discharge Summary   Patient ID: Connie Malone MRN: [id number] DOB/AGE: November 10, 1980 43 y.o.  Admit date: 07/05/2023  Discharge date: 07/06/2023  Discharge Diagnoses:  Principal Problem:   Neoplasm of uncertain behavior of thyroid  gland Active Problems:   Multiple thyroid  nodules   Monoallelic mutation of NRAS gene   Discharged Condition: good  Hospital Course: Patient was admitted for observation following total thyroidectomy.  Post op course was uncomplicated.  Pain was well controlled.  Tolerated diet.  Post op calcium level on morning following surgery was 9.2 mg/dl.  Patient was prepared for discharge home on POD#1.  Consults: None  Treatments: surgery: total thyroidectomy  Discharge Exam: Blood pressure (!) 135/96, pulse (!) 104, temperature (!) 97.5 F (36.4 C), temperature source Oral, resp. rate 18, height 5' 7 (1.702 m), weight 129.3 kg, last menstrual period 06/25/2023, SpO2 100%. HEENT - clear Neck - wound dry and intact; mild STS; voice normal; dermabond in place   Disposition: Home  Discharge Instructions     Diet - low sodium heart healthy   Complete by: As directed    Increase activity slowly   Complete by: As directed    No dressing needed   Complete by: As directed       Allergies as of 07/06/2023       Reactions   Latex Rash        Medication List     TAKE these medications    B-12 PO Take 1 tablet by mouth daily.   budesonide 3 MG 24 hr capsule Commonly known as: ENTOCORT EC Take 3 mg by mouth daily as needed (Crohn's flare).   calcium carbonate 500 MG chewable tablet Commonly known as: Tums Chew 2 tablets (400 mg of elemental calcium total) by mouth 2 (two) times daily.   clindamycin  1 % gel Commonly known as: CLINDAGEL Apply 1 Application topically daily. May apply again throughout the day as needed for HS flare   doxycycline  100 MG tablet Commonly known as: VIBRA -TABS Take 100 mg by mouth 2 (two) times  daily.   ELDERBERRY PO Take 1 capsule by mouth daily.   ferrous sulfate 325 (65 FE) MG tablet Take 325 mg by mouth every other day.   ibuprofen  600 MG tablet Commonly known as: ADVIL  Take 1 tablet (600 mg total) by mouth every 6 (six) hours. What changed:  when to take this reasons to take this   levothyroxine 100 MCG tablet Commonly known as: Synthroid Take 1 tablet (100 mcg total) by mouth daily before breakfast.   linaclotide 145 MCG Caps capsule Commonly known as: LINZESS Take 145 mcg by mouth daily as needed (constipation).   methocarbamol  500 MG tablet Commonly known as: ROBAXIN  Take 2 tablets (1,000 mg total) by mouth 2 (two) times daily. What changed:  how much to take when to take this reasons to take this   Multi-Vitamin tablet Take 1 tablet by mouth daily.   oxybutynin 10 MG 24 hr tablet Commonly known as: DITROPAN-XL Take 10 mg by mouth daily as needed (bladder spasms).   POTASSIUM PO Take 1 tablet by mouth daily.   sertraline  50 MG tablet Commonly known as: ZOLOFT  Take 50 mg by mouth daily.   Skyrizi 360 MG/2.4ML Soct Generic drug: Risankizumab-rzaa Inject 360 mg into the skin every 8 (eight) weeks.   traMADol  50 MG tablet Commonly known as: ULTRAM  Take 1-2 tablets (50-100 mg total) by mouth every 6 (six) hours as needed for moderate pain (pain  score 4-6).   traZODone 100 MG tablet Commonly known as: DESYREL Take 100 mg by mouth at bedtime.   VITAMIN D PO Take 1 capsule by mouth daily.               Discharge Care Instructions  (From admission, onward)           Start     Ordered   07/06/23 0000  No dressing needed        07/06/23 1109            Follow-up Information     Eletha Boas, MD. Schedule an appointment as soon as possible for a visit in 3 week(s).   Specialty: General Surgery Why: For wound re-check Contact information: [address] [place] [address] [phone]                  Boas Eletha, MD [facility] Office: [phone]   Signed: Boas Eletha 07/06/2023, 11:10 AM

## 2023-07-06 NOTE — Plan of Care (Signed)
  Problem: Education: Goal: Knowledge of General Education information will improve Description: Including pain rating scale, medication(s)/side effects and non-pharmacologic comfort measures Outcome: Progressing   Problem: Pain Managment: Goal: General experience of comfort will improve and/or be controlled Outcome: Progressing

## 2023-07-08 ENCOUNTER — Ambulatory Visit: Payer: Self-pay | Admitting: Surgery

## 2023-07-08 NOTE — Progress Notes (Signed)
 Final pathology results are all benign.  Good news.  I will have a printed copy of this report for the patient at her postoperative visit.  Krystal Spinner, MD [facility] practice Office: [phone]

## 2023-08-09 ENCOUNTER — Encounter: Payer: Self-pay | Admitting: Obstetrics and Gynecology

## 2023-09-03 ENCOUNTER — Other Ambulatory Visit: Payer: Self-pay

## 2023-09-06 ENCOUNTER — Encounter: Payer: Self-pay | Admitting: Obstetrics and Gynecology

## 2023-09-06 ENCOUNTER — Ambulatory Visit: Admitting: Obstetrics and Gynecology

## 2023-09-06 ENCOUNTER — Other Ambulatory Visit (HOSPITAL_COMMUNITY)
Admission: RE | Admit: 2023-09-06 | Discharge: 2023-09-06 | Disposition: A | Discharge status: Home / Self Care | Source: Ambulatory Visit | Attending: Obstetrics and Gynecology | Admitting: Obstetrics and Gynecology

## 2023-09-06 VITALS — BP 130/86 | HR 76 | Ht 67.0 in | Wt 303.0 lb

## 2023-09-06 DIAGNOSIS — Z01419 Encounter for gynecological examination (general) (routine) without abnormal findings: Secondary | ICD-10-CM | POA: Insufficient documentation

## 2023-09-06 DIAGNOSIS — Z1151 Encounter for screening for human papillomavirus (HPV): Secondary | ICD-10-CM | POA: Diagnosis not present

## 2023-09-06 DIAGNOSIS — Z113 Encounter for screening for infections with a predominantly sexual mode of transmission: Secondary | ICD-10-CM | POA: Insufficient documentation

## 2023-09-06 DIAGNOSIS — Z124 Encounter for screening for malignant neoplasm of cervix: Secondary | ICD-10-CM | POA: Insufficient documentation

## 2023-09-06 DIAGNOSIS — Z30432 Encounter for removal of intrauterine contraceptive device: Secondary | ICD-10-CM

## 2023-09-06 DIAGNOSIS — Z1331 Encounter for screening for depression: Secondary | ICD-10-CM

## 2023-09-06 NOTE — Progress Notes (Signed)
 Subjective:     Connie Malone is a 43 y.o. female P0 with amenorrhea secondary to Mirena IUD and BMI 47 who is here for a comprehensive physical exam. The patient reports no problems. She is sexually active without complaints. She denies pelvic pain or abnormal discharge. She denies urinary incontinence. She suffers form Chron's disease. Patient reports daily spotting with IUD. Patient reports right hip pain. She was seen by PCP who ordered an X-ray. X-ray suggests that IUD may be malpositioned. Patient is requesting IUD removal today. Patient desires a pregnancy  Past Medical History:  Diagnosis Date   Anemia    Anxiety    Bladder mass    Blood transfusion without reported diagnosis    Complication of anesthesia    itchy after anesthesia   Crohn disease (HCC)    Depression    Hydradenitis    Intestinal obstruction (HCC)    Iron deficiency anemia    Microcytic anemia    Past Surgical History:  Procedure Laterality Date   bladder mass  2021   bladder mass at [facility]   BREAST BIOPSY Left 06/24/2022   US  LT BREAST BX W LOC DEV 1ST LESION IMG BX SPEC US  GUIDE 06/24/2022 [facility]   BREAST BIOPSY Left 06/24/2022   US  LT BREAST BX W LOC DEV EA ADD LESION IMG BX SPEC US  GUIDE 06/24/2022 [facility]   BREAST BIOPSY Right 06/26/2022   US  RT BREAST BX W LOC DEV 1ST LESION IMG BX SPEC US  GUIDE 06/26/2022 [facility]   BREAST BIOPSY Right 06/26/2022   US  RT BREAST BX W LOC DEV EA ADD LESION IMG BX SPEC US  GUIDE 06/26/2022 [facility]   BREAST BIOPSY Right 06/26/2022   US  RT BREAST BX W LOC DEV EA ADD LESION IMG BX SPEC US  GUIDE 06/26/2022 [facility]   CYSTOSCOPY     MYOMECTOMY N/A 05/12/2022   Procedure: ABDOMINAL MYOMECTOMY;  Surgeon: Alger Gong, MD;  Location: [facility];  Service: Gynecology;  Laterality: N/A;   THYROIDECTOMY N/A 07/05/2023   Procedure: THYROIDECTOMY;  Surgeon: Eletha Boas, MD;  Location: [facility];  Service: General;  Laterality: N/A;   TOTAL THYROIDECTOMY   UMBILICAL HERNIA REPAIR  03/2022   At [facility] History  Problem Relation Age of Onset   Parkinson's disease Mother    Heart Problems Father     Social History   Socioeconomic History   Marital status: Single    Spouse name: Not on file   Number of children: 0   Years of education: Not on file   Highest education level: Not on file  Occupational History   Not on file  Tobacco Use   Smoking status: Former    Current packs/day: 0.00    Types: Cigarettes    Quit date: 05/10/2023    Years since quitting: 0.3   Smokeless tobacco: Never  Vaping Use   Vaping status: Never Used  Substance and Sexual Activity   Alcohol use: Yes    Comment: occasional   Drug use: No   Sexual activity: Yes    Birth control/protection: I.U.D.  Other Topics Concern   Not on file  Social History Narrative   Right Handed   Lives in a one story home    Social Drivers of Health   Financial Resource Strain: Medium Risk (07/11/2019)   Received from [facility] visits prior to 03/14/2022.   Overall Financial Resource Strain (CARDIA)    Difficulty of  Paying Living Expenses: Somewhat hard  Food Insecurity: No Food Insecurity (07/05/2023)   Hunger Vital Sign    Worried About Running Out of Food in the Last Year: Never true    Ran Out of Food in the Last Year: Never true  Transportation Needs: No Transportation Needs (07/05/2023)   PRAPARE - Administrator, Civil Service (Medical): No    Lack of Transportation (Non-Medical): No  Physical Activity: Not on file  Stress: Not on file  Social Connections: Not on file  Intimate Partner Violence: Not At Risk (07/05/2023)   Humiliation, Afraid, Rape, and Kick questionnaire    Fear of Current or Ex-Partner: No    Emotionally Abused: No    Physically Abused: No    Sexually Abused: No   Health Maintenance  Topic Date Due   Medicare Annual Wellness (AWV)  Never done   HIV Screening  Never done    Hepatitis C Screening  Never done   DTaP/Tdap/Td (1 - Tdap) Never done   Hepatitis B Vaccines 19-59 Average Risk (1 of 3 - 19+ 3-dose series) Never done   HPV VACCINES (1 - 3-dose SCDM series) Never done   COVID-19 Vaccine (1 - 2024-25 season) Never done   INFLUENZA VACCINE  08/13/2023   Cervical Cancer Screening (HPV/Pap Cotest)  02/05/2026   Pneumococcal Vaccine  Aged Out   Meningococcal B Vaccine  Aged Out       Review of Systems Pertinent items noted in HPI and remainder of comprehensive ROS otherwise negative.   Objective:  Blood pressure (!) 128/91, pulse 76, height 5' 7 (1.702 m), weight (!) 303 lb (137.4 kg).   GENERAL: Well-developed, well-nourished female in no acute distress.  HEENT: Normocephalic, atraumatic. Sclerae anicteric.  NECK: Supple. Normal thyroid .  LUNGS: Clear to auscultation bilaterally.  HEART: Regular rate and rhythm. BREASTS: Symmetric in size. No palpable masses or lymphadenopathy, skin changes, or nipple drainage. ABDOMEN: Soft, nontender, nondistended. No organomegaly. PELVIC: Normal external female genitalia. Vagina is pink and rugated.  Normal discharge. Normal appearing cervix. Uterus is normal in size. No adnexal mass or tenderness. Chaperone present during the pelvic exam EXTREMITIES: No cyanosis, clubbing, or edema, 2+ distal pulses.    GYNECOLOGY CLINIC PROCEDURE NOTE  IUD Removal  Patient identified, informed consent performed, consent signed.  Patient was in the dorsal lithotomy position, normal external genitalia was noted.  A speculum was placed in the patient's vagina, normal discharge was noted, no lesions. The cervix was visualized, no lesions, no abnormal discharge.  The strings of the IUD were grasped and pulled using ring forceps. The IUD was removed in its entirety. Patient tolerated the procedure well.       Assessment:    Healthy female exam.      Plan:    Pap smear collected Patient with normal mammogram 06/2023 [facility]  screening per patient request Patient will be contacted with abnormal results Patient encouraged to start prenatal vitamins Patient encouraged to resume recommended diet for Chron's and weight loss per nutritionist See After Visit Summary for Counseling Recommendations

## 2023-09-06 NOTE — Progress Notes (Signed)
 43 y.o. GYN presents for AEX/PAP/STD screening.  Pt did an Xray at her PCP and was told that her IUD is malpositioned.

## 2023-09-07 LAB — HIV ANTIBODY (ROUTINE TESTING W REFLEX): HIV Screen 4th Generation wRfx: NONREACTIVE

## 2023-09-07 LAB — CERVICOVAGINAL ANCILLARY ONLY
Chlamydia: NEGATIVE
Comment: NEGATIVE
Comment: NORMAL
Neisseria Gonorrhea: NEGATIVE

## 2023-09-07 LAB — HEPATITIS B SURFACE ANTIGEN: Hepatitis B Surface Ag: NEGATIVE

## 2023-09-07 LAB — RPR: RPR Ser Ql: NONREACTIVE

## 2023-09-07 LAB — HEPATITIS C ANTIBODY: Hep C Virus Ab: NONREACTIVE

## 2023-09-08 LAB — CYTOLOGY - PAP
Comment: NEGATIVE
Diagnosis: NEGATIVE
High risk HPV: NEGATIVE

## 2023-09-14 DIAGNOSIS — K50012 Crohn's disease of small intestine with intestinal obstruction: Secondary | ICD-10-CM | POA: Diagnosis not present

## 2023-10-12 ENCOUNTER — Ambulatory Visit (INDEPENDENT_AMBULATORY_CARE_PROVIDER_SITE_OTHER): Payer: Self-pay | Admitting: Licensed Clinical Social Worker

## 2023-10-12 DIAGNOSIS — F4323 Adjustment disorder with mixed anxiety and depressed mood: Secondary | ICD-10-CM | POA: Diagnosis not present

## 2023-10-12 NOTE — BH Specialist Note (Signed)
 Integrated Behavioral Health via Telemedicine Visit  10/18/2023 CLAIRA JETER [id number]  Number of Integrated Behavioral Health Clinician visits: 1- Initial Visit  Session Start time: 1445   Session End time: 1529  Total time in minutes: 44    Referring Provider: Dr. Alger Patient/Family location: Home [facility] Provider location: Remote Office All persons participating in visit: Patient and [facility] Types of Service: Individual psychotherapy and Video visit  I connected with Darlisa CHRISTELLA Moats and/or [name] [phone] patient via  Telephone or Video Enabled Telemedicine Application  (Video is Caregility application) and verified that I am speaking with the correct person using two identifiers. Discussed confidentiality: Yes   I discussed the limitations of telemedicine and the availability of in person appointments.  Discussed there is a possibility of technology failure and discussed alternative modes of communication if that failure occurs.  I discussed that engaging in this telemedicine visit, they consent to the provision of behavioral healthcare and the services will be billed under their insurance.  Patient and/or legal guardian expressed understanding and consented to Telemedicine visit: Yes   Presenting Concerns: Patient and/or family reports the following symptoms/concerns: increased depression and anxiety symptoms related to physical health Duration of problem: Years; Severity of problem: moderate  Patient and/or Family's Strengths/Protective Factors: Social connections, Social and Emotional competence, Concrete supports in place (healthy food, safe environments, etc.), Sense of purpose, and Physical Health (exercise, healthy diet, medication compliance, etc.)  Goals Addressed: Patient will:  Reduce symptoms of: anxiety and depression   Increase knowledge and/or ability of: coping skills and healthy habits   Demonstrate ability to: Increase healthy adjustment to current  life circumstances and Increase adequate support systems for patient/family  Progress towards Goals: Ongoing    Interventions: Interventions utilized:  Solution-Focused Strategies, Mindfulness or Management consultant, Supportive Counseling, Psychoeducation and/or Health Education, and Supportive Reflection Standardized Assessments completed: Not Needed    Patient and/or Family Response: Patient attended today's virtual session and engaged in processing increased symptoms of depression and anxiety related to her physical health. She reported being diagnosed with Crohn's disease in 2021, followed by significant life stressors including the loss of employment, housing, and transportation. Patient began Sertraline  50mg  in 2022. She currently resides with her mother and reports experiencing persistent irritability, low energy, and minimal motivation, often isolating in her room. The highlight of her week was attending church, her only recent activity outside the home. Although she identifies having a support system, she expresses difficulty trusting them. Patient demonstrated insight and expressed willingness to implement positive coping strategies, including walking in her neighborhood, attending the gym, visiting local parks/lakes, and creating a more structured daily routine to increase activity and engagement.   Clinical Assessment/Diagnosis  Adjustment disorder with mixed anxiety and depressed mood    Assessment: Patient currently experiencing increased symptoms of depression and anxiety, characterized by low energy, irritability, social withdrawal, and a lack of motivation. These symptoms appear to be linked to ongoing physical health issues and past life disruptions following her Crohn's disease diagnosis.   Patient may benefit from continued support of integrated behavioral health services.  Plan: Follow up with behavioral health clinician on : 10/19/2023 Behavioral recommendations:  patient to continue engaging in therapy to address her depressive and anxious symptoms, while gradually incorporating positive coping strategies such as regular physical activity, structured daily routines, and increased social engagement. Psychoeducation on trust-building and support system utilization may also be beneficial. Referral(s): Integrated [employer] (In Clinic)  I discussed the assessment and treatment  plan with the patient and/or parent/guardian. They were provided an opportunity to ask questions and all were answered. They agreed with the plan and demonstrated an understanding of the instructions.   They were advised to call back or seek an in-person evaluation if the symptoms worsen or if the condition fails to improve as anticipated.  Milayah Krell LITTIE Seats, LCSWA

## 2023-10-13 DIAGNOSIS — K50012 Crohn's disease of small intestine with intestinal obstruction: Secondary | ICD-10-CM | POA: Diagnosis not present

## 2023-10-13 DIAGNOSIS — D500 Iron deficiency anemia secondary to blood loss (chronic): Secondary | ICD-10-CM | POA: Diagnosis not present

## 2023-10-19 ENCOUNTER — Ambulatory Visit (INDEPENDENT_AMBULATORY_CARE_PROVIDER_SITE_OTHER): Payer: Self-pay | Admitting: Licensed Clinical Social Worker

## 2023-10-19 DIAGNOSIS — F4323 Adjustment disorder with mixed anxiety and depressed mood: Secondary | ICD-10-CM

## 2023-10-19 NOTE — BH Specialist Note (Unsigned)
 Integrated Behavioral Health via Telemedicine Visit  10/19/2023 Connie Malone [id number]  Number of Integrated Behavioral Health Clinician visits: 1- Initial Visit  Session Start time: 1445   Session End time: 1529  Total time in minutes: 44    Referring Provider: *** Patient/Family location: [facility] Provider location: *** All persons participating in visit: *** Types of Service: {CHL AMB TYPE OF [phone]}  I connected with Connie Malone and/or Connie Malone's {family members:20773} via  Telephone or Video Enabled Telemedicine Application  (Video is Caregility application) and verified that I am speaking with the correct person using two identifiers. Discussed confidentiality: {YES/NO:21197}  I discussed the limitations of telemedicine and the availability of in person appointments.  Discussed there is a possibility of technology failure and discussed alternative modes of communication if that failure occurs.  I discussed that engaging in this telemedicine visit, they consent to the provision of behavioral healthcare and the services will be billed under their insurance.  Patient and/or legal guardian expressed understanding and consented to Telemedicine visit: {YES/NO:21197}  Presenting Concerns: Patient and/or family reports the following symptoms/concerns: *** Duration of problem: ***; Severity of problem: {Mild/Moderate/Severe:20260}  Patient and/or Family's Strengths/Protective Factors: {CHL AMB BH PROTECTIVE FACTORS:[phone]}  Goals Addressed: Patient will:  Reduce symptoms of: {IBH Symptoms:21014056}   Increase knowledge and/or ability of: {IBH Patient Tools:21014057}   Demonstrate ability to: {IBH Goals:21014053}  Progress towards Goals: {CHL AMB BH PROGRESS TOWARDS GOALS:[phone]}    Interventions: Interventions utilized:  {IBH Interventions:21014054} Standardized Assessments completed: {IBH Screening Tools:21014051}    Patient  and/or Family Response: felt session last week was good.. went out the house 3times last week dr. Tomasa, dress to wear to church, went to see brother and friend..   Clinical Assessment/Diagnosis  No diagnosis found.    Assessment: Patient currently experiencing ***.   Patient may benefit from ***.  Plan: Follow up with behavioral health clinician on : *** Behavioral recommendations: *** Referral(s): {IBH Referrals:21014055}  I discussed the assessment and treatment plan with the patient and/or parent/guardian. They were provided an opportunity to ask questions and all were answered. They agreed with the plan and demonstrated an understanding of the instructions.   They were advised to call back or seek an in-person evaluation if the symptoms worsen or if the condition fails to improve as anticipated.  Connie Malone, LCSWA

## 2023-10-25 DIAGNOSIS — D500 Iron deficiency anemia secondary to blood loss (chronic): Secondary | ICD-10-CM | POA: Diagnosis not present

## 2023-11-04 ENCOUNTER — Ambulatory Visit (INDEPENDENT_AMBULATORY_CARE_PROVIDER_SITE_OTHER): Payer: Self-pay | Admitting: Licensed Clinical Social Worker

## 2023-11-04 DIAGNOSIS — F4323 Adjustment disorder with mixed anxiety and depressed mood: Secondary | ICD-10-CM

## 2023-11-04 NOTE — BH Specialist Note (Signed)
 Integrated Behavioral Health via Telemedicine Visit  11/10/2023 Connie Malone [id number]  Number of Integrated Behavioral Health Clinician visits: 3- Third Visit  Session Start time: 1315   Session End time: 1337  Total time in minutes: 22    Referring Provider: Dr. Alger Patient/Family location: [facility] Provider location: Remote Office All persons participating in visit: Patient and [facility] Types of Service: Individual psychotherapy and Video visit  I connected with Connie Malone and/or [name] [phone] patient via  Telephone or Video Enabled Telemedicine Application  (Video is Caregility application) and verified that I am speaking with the correct person using two identifiers. Discussed confidentiality: Yes   I discussed the limitations of telemedicine and the availability of in person appointments.  Discussed there is a possibility of technology failure and discussed alternative modes of communication if that failure occurs.  I discussed that engaging in this telemedicine visit, they consent to the provision of behavioral healthcare and the services will be billed under their insurance.  Patient and/or legal guardian expressed understanding and consented to Telemedicine visit: Yes   Presenting Concerns: Patient and/or family reports the following symptoms/concerns: Improvements with mood  Duration of problem: Months; Severity of problem: moderate  Patient and/or Family's Strengths/Protective Factors: Social and Emotional competence, Concrete supports in place (healthy food, safe environments, etc.), and Physical Health (exercise, healthy diet, medication compliance, etc.)  Goals Addressed: Patient will:  Reduce symptoms of: anxiety and depression   Increase knowledge and/or ability of: coping skills and healthy habits   Demonstrate ability to: Increase healthy adjustment to current life circumstances and Increase adequate support systems for  patient/family  Progress towards Goals: Ongoing    Interventions: Interventions utilized:  Mindfulness or Management Consultant, Supportive Counseling, Psychoeducation and/or Health Education, Communication Skills, and Supportive Reflection Standardized Assessments completed: Not Needed    Patient and/or Family Response: Patient was present for today's virtual therapy session. She reported being in a positive headspace and noted significant improvement in energy and mood following a recent iron treatment. With increased energy, patient shared that she has been more active and leaving the house more frequently. She described enjoying a recent out-of-town trip for her birthday and stated that she had a good experience, with plans to travel again soon. Patient also reported spending more time at her brother's home, noting that the change in environment has been beneficial for her well-being. Additionally, she discussed setting and maintaining firmer boundaries with her parents, which she identified as contributing to her improved mood, increased sense of control, and overall positive mindset.   Clinical Assessment/Diagnosis  Adjustment disorder with mixed anxiety and depressed mood    Assessment: Patient currently experiencing improved mood, increased energy, and overall emotional well-being following recent iron treatments. She reports benefiting from setting healthy boundaries, engaging in enjoyable activities, and maintaining a positive mindset..   Patient may benefit from continued support of integrated behavioral health services when needed.  Plan: Follow up with behavioral health clinician on : 11/18/2023 Behavioral recommendations: Patient to continue practicing self-care, maintaining healthy boundaries, and engaging in activities that promote emotional and physical well-being. Continued therapy is advised to support sustained positive mood, reinforce coping strategies, and encourage  ongoing balance in her relationships and daily routines. Referral(s): Integrated [employer] (In Clinic)  I discussed the assessment and treatment plan with the patient and/or parent/guardian. They were provided an opportunity to ask questions and all were answered. They agreed with the plan and demonstrated an understanding of the instructions.  They were advised to call back or seek an in-person evaluation if the symptoms worsen or if the condition fails to improve as anticipated.  Wendelyn Kiesling LITTIE Seats, LCSWA

## 2023-11-10 DIAGNOSIS — D500 Iron deficiency anemia secondary to blood loss (chronic): Secondary | ICD-10-CM | POA: Diagnosis not present

## 2023-11-17 DIAGNOSIS — D500 Iron deficiency anemia secondary to blood loss (chronic): Secondary | ICD-10-CM | POA: Diagnosis not present

## 2023-11-18 ENCOUNTER — Ambulatory Visit: Payer: Self-pay | Admitting: Licensed Clinical Social Worker

## 2023-11-18 DIAGNOSIS — F4323 Adjustment disorder with mixed anxiety and depressed mood: Secondary | ICD-10-CM | POA: Diagnosis not present

## 2023-11-24 NOTE — BH Specialist Note (Signed)
 Integrated Behavioral Health via Telemedicine Visit  11/24/2023 Connie Malone [id number]  Number of Integrated Behavioral Health Clinician visits: 4- Fourth Visit  Session Start time: 1345   Session End time: 1411  Total time in minutes: 26    Referring Provider: Dr. Alger Patient/Family location: Home Connie Malone Provider location: Remote Office All persons participating in visit: Patient and Connie Malone  Types of Service: Individual psychotherapy and Video visit  I connected with Connie Malone and/or Connie Malone [phone] patient via  Telephone or Video Enabled Telemedicine Application  (Video is Caregility application) and verified that I am speaking with the correct person using two identifiers. Discussed confidentiality: Yes   I discussed the limitations of telemedicine and the availability of in person appointments.  Discussed there is a possibility of technology failure and discussed alternative modes of communication if that failure occurs.  I discussed that engaging in this telemedicine visit, they consent to the provision of behavioral healthcare and the services will be billed under their insurance.  Patient and/or legal guardian expressed understanding and consented to Telemedicine visit: Yes   Presenting Concerns: Patient and/or family reports the following symptoms/concerns: Improvements with mood and symptoms.  Duration of problem: Months; Severity of problem: moderate  Patient and/or Family's Strengths/Protective Factors: Social and Emotional competence, Concrete supports in place (healthy food, safe environments, etc.), and Physical Health (exercise, healthy diet, medication compliance, etc.)  Goals Addressed: Patient will:  Reduce symptoms of: anxiety and depression   Increase knowledge and/or ability of: coping skills, healthy habits, and self-management skills   Demonstrate ability to: Increase healthy adjustment to current life circumstances and Increase adequate  support systems for patient/family  Progress towards Goals: Ongoing    Interventions: Interventions utilized:  Solution-Focused Strategies, Supportive Counseling, Psychoeducation and/or Health Education, Communication Skills, and Supportive Reflection Standardized Assessments completed: Not Needed    Patient and/or Family Response: Patient was present for today's virtual session. She reported not feeling her best today due to experiencing significant physical pain. The patient stated that she has been resting and taking time to relax, which has limited her motivation to engage in activities outside of the home. Despite the pain, she reports maintaining a positive mood and staying in good spirits. The patient continues to nurture and sustain healthy relationships with her parents, noting ongoing emotional support from them.   Clinical Assessment/Diagnosis  Adjustment disorder with mixed anxiety and depressed mood    Assessment: Patient currently experiencing physical discomfort contributing to reduced motivation and activity levels. However, she reports stable mood, positive outlook, and continued engagement in supportive family relationships..   Patient may benefit from continued support of integrated behavioral health services.  Plan: Follow up with behavioral health clinician on : 12/16/2023 Behavioral recommendations: patient to continue to prioritize rest and pain management while maintaining engagement in positive coping strategies to support emotional well-being. Encouraged to monitor mood and physical symptoms, and to maintain open communication with healthcare providers regarding pain and overall functioning. Continued participation in therapy is advised to reinforce emotional regulation and support during recovery. Referral(s): Integrated [employer] (In Clinic)  I discussed the assessment and treatment plan with the patient and/or parent/guardian. They were  provided an opportunity to ask questions and all were answered. They agreed with the plan and demonstrated an understanding of the instructions.   They were advised to call back or seek an in-person evaluation if the symptoms worsen or if the condition fails to improve as anticipated.  Connie Malone, LCSWA

## 2023-11-26 DIAGNOSIS — G629 Polyneuropathy, unspecified: Secondary | ICD-10-CM | POA: Diagnosis not present

## 2023-11-26 DIAGNOSIS — E049 Nontoxic goiter, unspecified: Secondary | ICD-10-CM | POA: Diagnosis not present

## 2023-11-26 DIAGNOSIS — E889 Metabolic disorder, unspecified: Secondary | ICD-10-CM | POA: Diagnosis not present

## 2023-11-26 DIAGNOSIS — G4700 Insomnia, unspecified: Secondary | ICD-10-CM | POA: Diagnosis not present

## 2023-11-26 DIAGNOSIS — M1990 Unspecified osteoarthritis, unspecified site: Secondary | ICD-10-CM | POA: Diagnosis not present

## 2023-11-26 DIAGNOSIS — L732 Hidradenitis suppurativa: Secondary | ICD-10-CM | POA: Diagnosis not present

## 2023-11-26 DIAGNOSIS — E890 Postprocedural hypothyroidism: Secondary | ICD-10-CM | POA: Diagnosis not present

## 2023-11-26 DIAGNOSIS — N939 Abnormal uterine and vaginal bleeding, unspecified: Secondary | ICD-10-CM | POA: Diagnosis not present

## 2023-11-26 DIAGNOSIS — F325 Major depressive disorder, single episode, in full remission: Secondary | ICD-10-CM | POA: Diagnosis not present

## 2023-11-26 DIAGNOSIS — K5090 Crohn's disease, unspecified, without complications: Secondary | ICD-10-CM | POA: Diagnosis not present

## 2023-12-16 ENCOUNTER — Encounter: Payer: Self-pay | Admitting: Licensed Clinical Social Worker

## 2023-12-21 ENCOUNTER — Ambulatory Visit (INDEPENDENT_AMBULATORY_CARE_PROVIDER_SITE_OTHER): Admitting: Neurology

## 2023-12-21 ENCOUNTER — Encounter: Payer: Self-pay | Admitting: Neurology

## 2023-12-21 VITALS — BP 136/88 | HR 87 | Ht 67.0 in | Wt 314.0 lb

## 2023-12-21 DIAGNOSIS — M79604 Pain in right leg: Secondary | ICD-10-CM

## 2023-12-21 DIAGNOSIS — R200 Anesthesia of skin: Secondary | ICD-10-CM

## 2023-12-21 NOTE — Patient Instructions (Addendum)
 Nerve testing of the right leg  We will refer you to Sports Medicine for their opinion on your leg pain  ELECTROMYOGRAM AND NERVE CONDUCTION STUDIES (EMG/NCS) INSTRUCTIONS  How to Prepare The neurologist conducting the EMG will need to know if you have certain medical conditions. Tell the neurologist and other EMG lab personnel if you: Have a pacemaker or any other electrical medical device Take blood-thinning medications Have hemophilia, a blood-clotting disorder that causes prolonged bleeding Bathing Take a shower or bath shortly before your exam in order to remove oils from your skin. Don't apply lotions or creams before the exam.  What to Expect You'll likely be asked to change into a hospital gown for the procedure and lie down on an examination table. The following explanations can help you understand what will happen during the exam.  Electrodes. The neurologist or a technician places surface electrodes at various locations on your skin depending on where you're experiencing symptoms. Or the neurologist may insert needle electrodes at different sites depending on your symptoms.  Sensations. The electrodes will at times transmit a tiny electrical current that you may feel as a twinge or spasm. The needle electrode may cause discomfort or pain that usually ends shortly after the needle is removed. If you are concerned about discomfort or pain, you may want to talk to the neurologist about taking a short break during the exam.  Instructions. During the needle EMG, the neurologist will assess whether there is any spontaneous electrical activity when the muscle is at rest - activity that isn't present in healthy muscle tissue - and the degree of activity when you slightly contract the muscle.  He or she will give you instructions on resting and contracting a muscle at appropriate times. Depending on what muscles and nerves the neurologist is examining, he or she may ask you to change positions  during the exam.  After your EMG You may experience some temporary, minor bruising where the needle electrode was inserted into your muscle. This bruising should fade within several days. If it persists, contact your primary care doctor.

## 2023-12-21 NOTE — Progress Notes (Signed)
 Follow-up Visit   Date: 12/21/2023    Connie Malone MRN: [id number] DOB: May 28, 1980    Connie Malone is a 43 y.o. right-handed female with Crohn's disease (June 2021), iron deficiency anemia, former smoker returning to the clinic for follow-up of feet numbness.  The patient was accompanied to the clinic by self.   IMPRESSION/PLAN: Right proximal thigh/lower abdominal wall pain, described as achy/soreness.  Pain does not have neuropathic features and neurological exam is normal.  Symptoms are not suggestive of lumbar radiculopathy.   - NCS/EMG of the right leg  - Referral to Sports Medicine Chronic bilateral feet numbness.  Prior NCS/EMG from 2023 was normal.  Discussed skin biopsy for small fiber neuropathy.  She would like to focus on her right leg pain first.   Return to clinic as needed  --------------------------------------------- History of present illness: Starting in early 2022, she began having numbness/tingling involving the hands and feet. Symptoms are constant with no exacerbating or alleviating factors.  Balance is fair. She has had two falls.  Walks unassisted.  She denies arm or leg weakness. She complains of intermittent cramps in the lower legs and feet.  She has been on Humira for Crohn's disease for the past year. Recent labs shows vitamin B12 deficiency (148), she has started oral supplementation.   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 Previously she was drinking alcohol almost everyday, but that was several years ago. Now,she drinks only occasionally.  She lives with her mother.   UPDATE 02/16/2021: She was last seen in January 2023 and had NCS/EMG of the right side which was normal. She continues to complain of numbness in the feet, less so in the hands.  She has been taking gabapentin , but it keeps her awake and has not changed her numbness.  No weakness.  She reports rare low back pain.   UPDATE 12/21/2023: Discussed the use of AI scribe  software for clinical note transcription with the patient, who gave verbal consent to proceed.  History of Present Illness Connie Malone is a 43 year old female with Crohn's disease who presents with recurrent right leg and abdominal pain.   She experiences recurrent pain in her right upper and anterior thigh as well as lower right abdomen, described as 'achy' and 'throbby,' which becomes sore after episodes. The pain has been ongoing for a while, occurring intermittently, and she is unsure if it originates from the abdomen or the leg. The pain can be severe enough to 'take me out,' and she often tries to sleep it off.  She denies low back pain.    The pain is sometimes triggered by activities such as walking. However, it can also occur spontaneously, even waking her from sleep. She has not identified a consistent trigger.  Due to her history of Crohn's disease, her use of ibuprofen  is limited. She alternates between Tylenol  and ibuprofen  for pain relief, with varying effectiveness.   No back pain or shooting pain down the legs. She experiences numbness and tingling in her feet, but not in the affected leg area. Prior nerve testing of the legs did not show neuropathy or lumbosacral radiculopathy.   Medications:  Current Outpatient Medications on File Prior to Visit  Medication Sig Dispense Refill   budesonide (ENTOCORT EC) 3 MG 24 hr capsule Take 3 mg by mouth daily as needed (Crohn's flare).     calcium  carbonate (TUMS) 500 MG chewable tablet Chew 2 tablets (400 mg of  elemental calcium  total) by mouth 2 (two) times daily. 90 tablet 1   clindamycin  (CLINDAGEL) 1 % gel Apply 1 Application topically daily. May apply again throughout the day as needed for HS flare     Cyanocobalamin  (B-12 PO) Take 1 tablet by mouth daily.     doxycycline  (VIBRA -TABS) 100 MG tablet Take 100 mg by mouth 2 (two) times daily.     ELDERBERRY PO Take 1 capsule by mouth daily.     ferrous sulfate 325 (65 FE) MG tablet  Take 325 mg by mouth every other day.     ibuprofen  (ADVIL ) 600 MG tablet Take 1 tablet (600 mg total) by mouth every 6 (six) hours. 30 tablet 3   levothyroxine  (SYNTHROID ) 100 MCG tablet Take 1 tablet (100 mcg total) by mouth daily before breakfast. 30 tablet 3   linaclotide (LINZESS) 145 MCG CAPS capsule Take 145 mcg by mouth daily as needed (constipation).     methocarbamol  (ROBAXIN ) 500 MG tablet Take 2 tablets (1,000 mg total) by mouth 2 (two) times daily. 20 tablet 0   Multiple Vitamin (MULTI-VITAMIN) tablet Take 1 tablet by mouth daily.     oxybutynin (DITROPAN-XL) 10 MG 24 hr tablet Take 10 mg by mouth daily as needed (bladder spasms).     POTASSIUM PO Take 1 tablet by mouth daily.     Risankizumab-rzaa (SKYRIZI) 360 MG/2.4ML SOCT Inject 360 mg into the skin every 8 (eight) weeks.     sertraline  (ZOLOFT ) 50 MG tablet Take 50 mg by mouth daily.     traMADol  (ULTRAM ) 50 MG tablet Take 1-2 tablets (50-100 mg total) by mouth every 6 (six) hours as needed for moderate pain (pain score 4-6). 15 tablet 0   traZODone  (DESYREL ) 100 MG tablet Take 100 mg by mouth at bedtime.     VITAMIN D PO Take 1 capsule by mouth daily.     No current facility-administered medications on file prior to visit.    Allergies:  Allergies  Allergen Reactions   Latex Rash    Vital Signs:  BP 136/88   Pulse 87   Ht 5' 7 (1.702 m)   Wt (!) 314 lb (142.4 kg)   SpO2 96%   BMI 49.18 kg/m    Neurological Exam: MENTAL STATUS including orientation to time, place, person, recent and remote memory, attention span and concentration, language, and fund of knowledge is normal.  Speech is not dysarthric.  CRANIAL NERVES:  Pupils equal round and reactive to light.  Normal conjugate, extra-ocular eye movements in all directions of gaze.  No ptosis .  Face is symmetric.  MOTOR:  Motor strength is 5/5 in all extremities.  No atrophy, fasciculations or abnormal movements.  No pronator drift.  Tone is normal.    MSRs:   Reflexes are 2+/4 throughout.  SENSORY:  Intact to vibration throughout.  COORDINATION/GAIT: Gait narrow based and stable.   Data: NCS/EMG of the legs 02/25/2021:  This is a normal study of the right upper and lower extremities.  In particular, there is no evidence of a large fiber sensorimotor polyneuropathy or cervical/lumbosacral radiculopathy.   Thank you for allowing me to participate in patient's care.  If I can answer any additional questions, I would be pleased to do so.    Sincerely,    Caitland Porchia K. Tobie, DO

## 2023-12-23 ENCOUNTER — Encounter: Payer: Self-pay | Admitting: Physician Assistant

## 2023-12-23 ENCOUNTER — Other Ambulatory Visit: Payer: Self-pay

## 2023-12-23 ENCOUNTER — Ambulatory Visit: Admitting: Physician Assistant

## 2023-12-23 DIAGNOSIS — M79604 Pain in right leg: Secondary | ICD-10-CM | POA: Diagnosis not present

## 2023-12-23 NOTE — Progress Notes (Signed)
 Office Visit Note   Patient: Connie Malone           Date of Birth: 19-Feb-1980           MRN: [id number] Visit Date: 12/23/2023              Requested by: Patel, Donika K, DO [address] [name],  [place] [id number] PCP: Chet Mad, DO   Assessment & Plan: Visit Diagnoses:  1. Pain in right leg     Plan: Patient is a pleasant 43 year old woman who comes in with a 1+ year history of right groin pain radiating down the anterior aspect of her thigh.  She denies any injury.  She describes the pain is episodic but when it happens it is extremely painful and can cause her to fall.  She also has a history of paresthesias in her feet that is being worked up with EMG studies by neurology.  She does not have symptoms today but had them last night.  Her exam is fairly normal.  Her back and hip x-rays do not show any reason for her difficulties.  Unfortunately she cannot take anti-inflammatories because she has a history of Crohn's disease.  She has tried to do some home exercises but is not improved.  Will try to authorize an MRI she understands this may require physical therapy first.  I will also call her in some muscle relaxant as she does get locking and catching in the hip when this occurs  Follow-Up Instructions: No follow-ups on file.   Orders:  Orders Placed This Encounter  Procedures   XR Lumbar Spine 2-3 Views   XR HIP UNILAT W OR W/O PELVIS 1V RIGHT   No orders of the defined types were placed in this encounter.     Procedures: No procedures performed   Clinical Data: No additional findings.   Subjective: Chief Complaint  Patient presents with   Right Hip - Pain   Right Leg - Pain    HPI patient is a pleasant 43 year old woman comes in today complaining of right leg pain and foot numbness.  Also some groin pain.  She has also had increasing numbness in her feet she has been seen by a neurologist and electrodiagnostic studies have been ordered  she denies any particular injury.  She has also been seen by her primary care to rule out other causes for this pain  Review of Systems  All other systems reviewed and are negative.    Objective: Vital Signs: There were no vitals taken for this visit.  Physical Exam Constitutional:      Appearance: Normal appearance.  Pulmonary:     Effort: Pulmonary effort is normal.  Skin:    General: Skin is warm and dry.  Neurological:     General: No focal deficit present.     Mental Status: She is alert and oriented to person, place, and time.     Ortho Exam Review of her right hip she has got excellent strength with resisted extension and flexion of her leg dorsiflexion plantarflexion of her ankle.  She has no pain with logrolling of the hip.  She has no pain in her low back.  No pain over the lateral hip.  Mostly when she does get it although she does not have it today as it the groin.  Specialty Comments:  No specialty comments available.  Imaging: No results found.   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   Intramural, submucous, and subserous leiomyoma of uterus 05/12/2022   S/P myomectomy 05/12/2022   Floaters in visual field, bilateral 02/16/2022   Incisional hernia, without obstruction or gangrene 02/11/2022   Abnormal uterine bleeding (AUB) 02/05/2021   Depression 02/05/2021   IUD (intrauterine device) in place 02/05/2021   Crohn's disease of ileum with intestinal obstruction (HCC) 07/27/2019   Iron deficiency anemia due to chronic blood loss 07/27/2019   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   Abnormal CT scan 07/04/2019   Acute abdominal pain 07/04/2019   Generalized abdominal pain 07/04/2019   Microcytic anemia 07/04/2019   Past Medical History:  Diagnosis Date   Anemia    Anxiety    Bladder mass     Blood transfusion without reported diagnosis    Complication of anesthesia    itchy after anesthesia   Crohn disease (HCC)    Depression    Hydradenitis    Intestinal obstruction (HCC)    Iron deficiency anemia    Microcytic anemia     Family History  Problem Relation Age of Onset   Parkinson's disease Mother    Heart Problems Father     Past Surgical History:  Procedure Laterality Date   bladder mass  2021   bladder mass at [facility]   BREAST BIOPSY Left 06/24/2022   US  LT BREAST BX W LOC DEV 1ST LESION IMG BX SPEC US  GUIDE 06/24/2022 [facility]   BREAST BIOPSY Left 06/24/2022   US  LT BREAST BX W LOC DEV EA ADD LESION IMG BX SPEC US  GUIDE 06/24/2022 [facility]   BREAST BIOPSY Right 06/26/2022   US  RT BREAST BX W LOC DEV 1ST LESION IMG BX SPEC US  GUIDE 06/26/2022 [facility]   BREAST BIOPSY Right 06/26/2022   US  RT BREAST BX W LOC DEV EA ADD LESION IMG BX SPEC US  GUIDE 06/26/2022 [facility]   BREAST BIOPSY Right 06/26/2022   US  RT BREAST BX W LOC DEV EA ADD LESION IMG BX SPEC US  GUIDE 06/26/2022 [facility]   CYSTOSCOPY     MYOMECTOMY N/A 05/12/2022   Procedure: ABDOMINAL MYOMECTOMY;  Surgeon: Alger Gong, MD;  Location: [facility];  Service: Gynecology;  Laterality: N/A;   THYROIDECTOMY N/A 07/05/2023   Procedure: THYROIDECTOMY;  Surgeon: Eletha Boas, MD;  Location: [facility];  Service: General;  Laterality: N/A;  TOTAL THYROIDECTOMY   UMBILICAL HERNIA REPAIR  03/2022   At [facility]   Social History   Occupational History   Not on file  Tobacco Use   Smoking status: Former    Current packs/day: 0.00    Types: Cigarettes    Quit date: 05/10/2023    Years since quitting: 0.6   Smokeless tobacco: Never  Vaping Use   Vaping status: Never Used  Substance and Sexual Activity   Alcohol use: Yes    Comment: occasional   Drug use: No   Sexual activity: Yes    Birth control/protection: I.U.D.

## 2023-12-24 ENCOUNTER — Other Ambulatory Visit: Payer: Self-pay | Admitting: Physician Assistant

## 2023-12-24 MED ORDER — METHOCARBAMOL 500 MG PO TABS: 500.0000 mg | ORAL_TABLET | Freq: Four times a day (QID) | ORAL | 0 refills | Status: AC | PRN

## 2024-03-02 ENCOUNTER — Encounter: Admitting: Neurology
